# Patient Record
Sex: Female | Born: 1935 | Race: White | Hispanic: No | Marital: Married | Smoking: Never smoker
Health system: Southern US, Community
[De-identification: ages and names within clinical notes are randomized; demographics above are authoritative.]

## PROBLEM LIST (undated history)

## (undated) DIAGNOSIS — I10 Essential (primary) hypertension: Secondary | ICD-10-CM

## (undated) DIAGNOSIS — E538 Deficiency of other specified B group vitamins: Secondary | ICD-10-CM

## (undated) DIAGNOSIS — C07 Malignant neoplasm of parotid gland: Secondary | ICD-10-CM

## (undated) DIAGNOSIS — E785 Hyperlipidemia, unspecified: Secondary | ICD-10-CM

## (undated) DIAGNOSIS — I493 Ventricular premature depolarization: Secondary | ICD-10-CM

## (undated) DIAGNOSIS — I2510 Atherosclerotic heart disease of native coronary artery without angina pectoris: Secondary | ICD-10-CM

## (undated) DIAGNOSIS — E042 Nontoxic multinodular goiter: Secondary | ICD-10-CM

## (undated) HISTORY — PX: CORONARY ARTERY BYPASS GRAFT: SHX141

## (undated) HISTORY — DX: Nontoxic multinodular goiter: E04.2

## (undated) HISTORY — PX: ABDOMINAL HYSTERECTOMY: SUR658

## (undated) HISTORY — PX: MASTECTOMY: SHX3

## (undated) HISTORY — DX: Hyperlipidemia, unspecified: E78.5

## (undated) HISTORY — DX: Ventricular premature depolarization: I49.3

## (undated) HISTORY — DX: Essential (primary) hypertension: I10

## (undated) HISTORY — DX: Malignant neoplasm of parotid gland: C07

## (undated) HISTORY — DX: Atherosclerotic heart disease of native coronary artery without angina pectoris: I25.10

## (undated) HISTORY — DX: Deficiency of other specified B group vitamins: E53.8

---

## 2000-05-24 ENCOUNTER — Encounter: Admission: RE | Admit: 2000-05-24 | Discharge: 2000-05-24 | Payer: Self-pay | Admitting: Internal Medicine

## 2000-05-24 ENCOUNTER — Encounter: Payer: Self-pay | Admitting: Internal Medicine

## 2002-01-23 ENCOUNTER — Inpatient Hospital Stay (HOSPITAL_COMMUNITY): Admission: RE | Admit: 2002-01-23 | Discharge: 2002-01-29 | Payer: Self-pay | Admitting: Cardiology

## 2002-01-23 ENCOUNTER — Encounter: Payer: Self-pay | Admitting: Surgery

## 2002-01-24 ENCOUNTER — Encounter: Payer: Self-pay | Admitting: Surgery

## 2002-01-25 ENCOUNTER — Encounter: Payer: Self-pay | Admitting: Surgery

## 2002-01-26 ENCOUNTER — Encounter: Payer: Self-pay | Admitting: Cardiothoracic Surgery

## 2002-01-27 ENCOUNTER — Encounter: Payer: Self-pay | Admitting: Cardiothoracic Surgery

## 2015-11-17 DIAGNOSIS — E785 Hyperlipidemia, unspecified: Secondary | ICD-10-CM

## 2015-11-17 DIAGNOSIS — I10 Essential (primary) hypertension: Secondary | ICD-10-CM | POA: Insufficient documentation

## 2015-11-17 DIAGNOSIS — I2510 Atherosclerotic heart disease of native coronary artery without angina pectoris: Secondary | ICD-10-CM | POA: Insufficient documentation

## 2015-11-17 DIAGNOSIS — I493 Ventricular premature depolarization: Secondary | ICD-10-CM

## 2015-11-17 HISTORY — DX: Ventricular premature depolarization: I49.3

## 2015-11-17 HISTORY — DX: Atherosclerotic heart disease of native coronary artery without angina pectoris: I25.10

## 2015-11-17 HISTORY — DX: Essential (primary) hypertension: I10

## 2015-11-17 HISTORY — DX: Hyperlipidemia, unspecified: E78.5

## 2016-08-14 DIAGNOSIS — C07 Malignant neoplasm of parotid gland: Secondary | ICD-10-CM | POA: Insufficient documentation

## 2016-08-14 DIAGNOSIS — E042 Nontoxic multinodular goiter: Secondary | ICD-10-CM

## 2016-08-14 HISTORY — DX: Nontoxic multinodular goiter: E04.2

## 2016-08-14 HISTORY — DX: Malignant neoplasm of parotid gland: C07

## 2016-10-16 DIAGNOSIS — I10 Essential (primary) hypertension: Secondary | ICD-10-CM | POA: Diagnosis not present

## 2016-10-16 DIAGNOSIS — F419 Anxiety disorder, unspecified: Secondary | ICD-10-CM

## 2016-10-16 DIAGNOSIS — Z951 Presence of aortocoronary bypass graft: Secondary | ICD-10-CM | POA: Diagnosis not present

## 2016-10-16 DIAGNOSIS — K219 Gastro-esophageal reflux disease without esophagitis: Secondary | ICD-10-CM

## 2016-10-16 DIAGNOSIS — I2510 Atherosclerotic heart disease of native coronary artery without angina pectoris: Secondary | ICD-10-CM | POA: Diagnosis not present

## 2016-10-16 DIAGNOSIS — E785 Hyperlipidemia, unspecified: Secondary | ICD-10-CM

## 2016-10-16 DIAGNOSIS — I214 Non-ST elevation (NSTEMI) myocardial infarction: Secondary | ICD-10-CM | POA: Diagnosis not present

## 2016-10-17 DIAGNOSIS — I10 Essential (primary) hypertension: Secondary | ICD-10-CM | POA: Diagnosis not present

## 2016-10-17 DIAGNOSIS — Z951 Presence of aortocoronary bypass graft: Secondary | ICD-10-CM | POA: Diagnosis not present

## 2016-10-17 DIAGNOSIS — I2510 Atherosclerotic heart disease of native coronary artery without angina pectoris: Secondary | ICD-10-CM | POA: Diagnosis not present

## 2016-10-17 DIAGNOSIS — I214 Non-ST elevation (NSTEMI) myocardial infarction: Secondary | ICD-10-CM | POA: Diagnosis not present

## 2016-10-18 DIAGNOSIS — I214 Non-ST elevation (NSTEMI) myocardial infarction: Secondary | ICD-10-CM

## 2016-10-18 HISTORY — DX: Non-ST elevation (NSTEMI) myocardial infarction: I21.4

## 2016-10-23 DIAGNOSIS — Z951 Presence of aortocoronary bypass graft: Secondary | ICD-10-CM

## 2016-10-23 DIAGNOSIS — I208 Other forms of angina pectoris: Secondary | ICD-10-CM | POA: Diagnosis not present

## 2016-10-23 DIAGNOSIS — E785 Hyperlipidemia, unspecified: Secondary | ICD-10-CM

## 2016-10-23 DIAGNOSIS — R079 Chest pain, unspecified: Secondary | ICD-10-CM | POA: Diagnosis not present

## 2016-10-23 DIAGNOSIS — I10 Essential (primary) hypertension: Secondary | ICD-10-CM | POA: Diagnosis not present

## 2016-10-23 DIAGNOSIS — I25810 Atherosclerosis of coronary artery bypass graft(s) without angina pectoris: Secondary | ICD-10-CM | POA: Diagnosis not present

## 2016-10-23 DIAGNOSIS — F419 Anxiety disorder, unspecified: Secondary | ICD-10-CM

## 2016-10-24 DIAGNOSIS — I25810 Atherosclerosis of coronary artery bypass graft(s) without angina pectoris: Secondary | ICD-10-CM | POA: Diagnosis not present

## 2016-10-24 DIAGNOSIS — I10 Essential (primary) hypertension: Secondary | ICD-10-CM | POA: Diagnosis not present

## 2016-10-24 DIAGNOSIS — R079 Chest pain, unspecified: Secondary | ICD-10-CM | POA: Diagnosis not present

## 2016-10-24 DIAGNOSIS — I208 Other forms of angina pectoris: Secondary | ICD-10-CM | POA: Diagnosis not present

## 2017-05-28 NOTE — Progress Notes (Deleted)
Cardiology Office Note:    Date:  05/28/2017   ID:  Jarome Matin, DOB [birth year], MRN [id number]  PCP:  No primary care provider on file.  Cardiologist:  Shirlee More, MD    Referring MD: No ref. provider found    ASSESSMENT:    1. Coronary artery disease of native artery of native heart with stable angina pectoris (HCC)    PLAN:    In order of problems listed above:  1. ***   Next appointment: ***   Medication Adjustments/Labs and Tests Ordered: Current medicines are reviewed at length with the patient today.  Concerns regarding medicines are outlined above.  No orders of the defined types were placed in this encounter.  No orders of the defined types were placed in this encounter.   No chief complaint on file.   History of Present Illness:    Christine Rich is a 83 y.o. female with a hx of Hypertension, hyperlipidemia,CABG in 2003 and non STEMI with  PCI/DES to SVG in January 2018 last seen in April 2018. Compliance with diet, lifestyle and medications: *** Past Medical History:  Diagnosis Date  . Coronary artery disease involving native coronary artery of native heart 11/17/2015   Overview:  s/p CABGSx5 in 2003Lexiscan MPS in Aug 2011 without ischemia, EF 65% Cardiolite 10/29/12: CONCLUSIONS:  Study quality: fair, with obvious inferior attenuation artifact reducing the sensitivity of the test  (1) diaphragm attenuation artifact - prior non-transmural infarction cannot be excluded  (2) normal LV size, with ? inferior hypokinesis - measured LVEF of 48% may be an underestimate  (3) no areas of (vasodilator) stress-induced hypoperfusion are identified.  Negative perfusion stress test for potenital ischemia.   Cardiac catheterization January 2018 drug-eluting stent to graft going to the posterior descending artery  . Essential hypertension 11/17/2015  . Hyperlipemia 11/17/2015  . Nontoxic multinodular goiter 08/14/2016  . 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 PVCs (premature ventricular contractions) 11/17/2015    Past Surgical History:  Procedure Laterality Date  . ABDOMINAL HYSTERECTOMY    . CORONARY ARTERY BYPASS GRAFT    . MASTECTOMY      Current Medications: No outpatient prescriptions have been marked as taking for the 05/29/17 encounter (Appointment) with Richardo Priest, MD.     Allergies:   Patient has no known allergies.   Social History   Social History  . Marital status: Married    Spouse name: N/A  . Number of children: N/A  . Years of education: N/A   Social History Main Topics  . Smoking status: Never Smoker  . Smokeless tobacco: Never Used  . Alcohol use No  . Drug use: No  . Sexual activity: Not on file   Other Topics Concern  . Not on file   Social History Narrative  . No narrative on file     Family History: The patient's ***family history includes Heart disease in her father and mother. ROS:   Please see the history of present illness.    All other systems reviewed and are negative.  EKGs/Labs/Other Studies Reviewed:    The following studies were reviewed today:  EKG:  EKG ordered today.  The ekg ordered today demonstrates ***  Recent Labs: No results found for requested labs within last 8760 hours.  Recent Lipid Panel No results found for: CHOL, TRIG, HDL, CHOLHDL, VLDL, LDLCALC, LDLDIRECT  Physical Exam:    VS:  There were no vitals taken for this visit.  Wt Readings from Last 3 Encounters:  No data found for Wt     GEN: *** Well nourished, well developed in no acute distress HEENT: Normal NECK: No JVD; No carotid bruits LYMPHATICS: No lymphadenopathy CARDIAC: ***RRR, no murmurs, rubs, gallops RESPIRATORY:  Clear to auscultation without rales, wheezing or rhonchi  ABDOMEN: Soft, non-tender, non-distended MUSCULOSKELETAL:  No edema; No deformity  SKIN: Warm and dry NEUROLOGIC:  Alert and oriented x 3 PSYCHIATRIC:  Normal affect    Signed, Shirlee More, MD    05/28/2017 4:07 PM    [facility]

## 2017-05-29 ENCOUNTER — Ambulatory Visit: Payer: BLUE CROSS/BLUE SHIELD | Admitting: Cardiology

## 2017-07-23 ENCOUNTER — Telehealth: Payer: Self-pay | Admitting: Cardiology

## 2017-07-24 NOTE — Telephone Encounter (Signed)
Started noteand not needed

## 2017-07-25 NOTE — Progress Notes (Signed)
Cardiology Office Note:    Date:  07/26/2017   ID:  Christine Rich, DOB [birth year], MRN [id number]  PCP:  Street, Sharon Mt, MD  Cardiologist:  Shirlee More, MD    Referring MD: Street, Sharon Mt, *  [facility]   ASSESSMENT:    1. Coronary artery disease of native artery of native heart with stable angina pectoris ([place])   2. Essential hypertension   3. PVCs (premature ventricular contractions)   4. Hyperlipidemia, unspecified hyperlipidemia type    PLAN:    In order of problems listed above:  1. Stable, can proceed with oral surgery provided her DAPT is not interrupted. Her CAD is stable the procedure is low risk and I don't feel she requires any further cardiac evaluation preoperatively. 2. Stable continue treatment including beta blocker ARB 3. Stable, no PVC is seen on todays EKG continue beta blocker 4. Stable continue her statin recent labs requested from her PCP for liver function and lipid profile.   Next appointment: 6 months   Medication Adjustments/Labs and Tests Ordered: Current medicines are reviewed at length with the patient today.  Concerns regarding medicines are outlined above.  No orders of the defined types were placed in this encounter.  No orders of the defined types were placed in this encounter.   Chief Complaint  Patient presents with  . Follow-up    6 month flup appt    History of Present Illness:    Christine Rich is a 83 y.o. female with a hx of CAD with CABG in 200[address] and nonSTEMI 10/17/16 with PCI and DES to SVG to PDA and EF 55-60%,, Dyslipidemia, HTN, S/P CABG in 2003  last seen 6 months ago.She is scheduled for extensive dental surgery. Thet are aware that she needs to continue DAPT without interuption.She has had no angina dyspnea palpitation or syncope.I discussed her care plan with oral surgery. Compliance with diet, lifestyle and medications: yes  Past Medical History:  Diagnosis Date  . Coronary artery disease  involving native coronary artery of native heart 11/17/2015   Overview:  s/p CABGSx5 in 2003Lexiscan MPS in Aug 2011 without ischemia, EF 65% Cardiolite 10/29/12: CONCLUSIONS:  Study quality: fair, with obvious inferior attenuation artifact reducing the sensitivity of the test  (1) diaphragm attenuation artifact - prior non-transmural infarction cannot be excluded  (2) normal LV size, with ? inferior hypokinesis - measured LVEF of 48% may be an underestimate  (3) no areas of (vasodilator) stress-induced hypoperfusion are identified.  Negative perfusion stress test for potenital ischemia.   Cardiac catheterization January 2018 drug-eluting stent to graft going to the posterior descending artery  . Essential hypertension 11/17/2015  . Hyperlipemia 11/17/2015  . Nontoxic multinodular goiter 08/14/2016  . 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 PVCs (premature ventricular contractions) 11/17/2015    Past Surgical History:  Procedure Laterality Date  . ABDOMINAL HYSTERECTOMY    . CORONARY ARTERY BYPASS GRAFT    . MASTECTOMY      Current Medications: Current Meds  Medication Sig  . aspirin EC 81 MG tablet Take 81 mg by mouth daily.  . citalopram (CELEXA) 20 MG tablet Take 20 mg by mouth daily.  Marland Kitchen losartan (COZAAR) 25 MG tablet Take 25 mg by mouth daily.  . metoprolol tartrate (LOPRESSOR) 50 MG tablet Take 75 mg by mouth 2 (two) times daily.  . prasugrel (EFFIENT) 10 MG TABS tablet Take 10 mg by mouth daily.  . rosuvastatin (CRESTOR) 40 MG  tablet Take 40 mg by mouth daily.     Allergies:   Patient has no known allergies.   Social History   Social History  . Marital status: Married    Spouse name: N/A  . Number of children: N/A  . Years of education: N/A   Social History Main Topics  . Smoking status: Never Smoker  . Smokeless tobacco: Never Used  . Alcohol use No  . Drug use: No  . Sexual activity: Not Asked   Other Topics Concern  . None   Social History  Narrative  . None     Family History: The patient's family history includes Heart disease in her father and mother. ROS:   Please see the history of present illness.    All other systems reviewed and are negative.  EKGs/Labs/Other Studies Reviewed:    The following studies were reviewed today:  EKG:  EKG ordered today.  The ekg ordered today demonstrates [place], RBBB  Recent Labs: recent labs requseted from her PCP No results found for requested labs within last 8760 hours.  Recent Lipid Panel No results found for: CHOL, TRIG, HDL, CHOLHDL, VLDL, LDLCALC, LDLDIRECT  Physical Exam:    VS:  Ht 5\' 1"  (1.549 m)   Wt 155 lb 6.4 oz (70.5 kg)   BMI 29.36 kg/m     Wt Readings from Last 3 Encounters:  07/26/17 155 lb 6.4 oz (70.5 kg)     GEN:  Well nourished, well developed in no acute distress HEENT: Normal NECK: No JVD; No carotid bruits LYMPHATICS: No lymphadenopathy CARDIAC: RRR, no murmurs, rubs, gallops RESPIRATORY:  Clear to auscultation without rales, wheezing or rhonchi  ABDOMEN: Soft, non-tender, non-distended MUSCULOSKELETAL:  No edema; No deformity  SKIN: Warm and dry NEUROLOGIC:  Alert and oriented x 3 PSYCHIATRIC:  Normal affect    Signed, Shirlee More, MD  07/26/2017 1:28 PM    [facility]

## 2017-07-26 ENCOUNTER — Encounter: Payer: Self-pay | Admitting: Cardiology

## 2017-07-26 ENCOUNTER — Ambulatory Visit (INDEPENDENT_AMBULATORY_CARE_PROVIDER_SITE_OTHER): Payer: Medicare Other | Admitting: Cardiology

## 2017-07-26 VITALS — BP 124/74 | HR 64 | Ht 61.0 in | Wt 155.4 lb

## 2017-07-26 DIAGNOSIS — I25118 Atherosclerotic heart disease of native coronary artery with other forms of angina pectoris: Secondary | ICD-10-CM | POA: Diagnosis not present

## 2017-07-26 DIAGNOSIS — E785 Hyperlipidemia, unspecified: Secondary | ICD-10-CM | POA: Diagnosis not present

## 2017-07-26 DIAGNOSIS — I493 Ventricular premature depolarization: Secondary | ICD-10-CM

## 2017-07-26 DIAGNOSIS — I10 Essential (primary) hypertension: Secondary | ICD-10-CM | POA: Diagnosis not present

## 2017-07-26 NOTE — Patient Instructions (Signed)

## 2017-12-31 ENCOUNTER — Telehealth: Payer: Self-pay | Admitting: Cardiology

## 2017-12-31 MED ORDER — PRASUGREL HCL 10 MG PO TABS: 10.0000 mg | ORAL_TABLET | Freq: Every day | ORAL | 7 refills | Status: DC

## 2017-12-31 NOTE — Telephone Encounter (Signed)
Call Prasugrel to [facility]

## 2017-12-31 NOTE — Telephone Encounter (Signed)
Refill sent.

## 2018-01-23 DIAGNOSIS — M542 Cervicalgia: Secondary | ICD-10-CM

## 2018-01-23 DIAGNOSIS — C50912 Malignant neoplasm of unspecified site of left female breast: Secondary | ICD-10-CM

## 2018-01-23 DIAGNOSIS — M47812 Spondylosis without myelopathy or radiculopathy, cervical region: Secondary | ICD-10-CM

## 2018-01-23 HISTORY — DX: Spondylosis without myelopathy or radiculopathy, cervical region: M47.812

## 2018-01-23 HISTORY — DX: Cervicalgia: M54.2

## 2018-01-23 HISTORY — DX: Malignant neoplasm of unspecified site of left female breast: C50.912

## 2018-09-03 ENCOUNTER — Telehealth: Payer: Self-pay | Admitting: Cardiology

## 2018-09-03 NOTE — Telephone Encounter (Signed)
Wants nitro called to [facility]/past due for follow up (due in April) wouldn't schedule appt

## 2018-09-04 MED ORDER — NITROGLYCERIN 0.4 MG SL SUBL: 0.4000 mg | SUBLINGUAL_TABLET | SUBLINGUAL | 0 refills | Status: DC | PRN

## 2018-09-04 NOTE — Telephone Encounter (Signed)
Patient agreeable to schedule overdue follow up appointment on 10/25/17 at 9:00 am with Dr. Bettina Gavia. Refill for nitroglycerin sent to [facility]. No further questions.

## 2018-09-04 NOTE — Addendum Note (Signed)
Addended by: Austin Miles on: 09/04/2018 03:20 PM   Modules accepted: Orders

## 2018-10-24 NOTE — Progress Notes (Deleted)
Cardiology Office Note:    Date:  10/24/2018   ID:  Christine Rich, DOB 07/04/1934, MRN [id number]  PCP:  Street, Sharon Mt, MD  Cardiologist:  Shirlee More, MD    Referring MD: [address] Sharon Mt, *    ASSESSMENT:    No diagnosis found. PLAN:    In order of problems listed above:  1. ***   Next appointment: ***   Medication Adjustments/Labs and Tests Ordered: Current medicines are reviewed at length with the patient today.  Concerns regarding medicines are outlined above.  No orders of the defined types were placed in this encounter.  No orders of the defined types were placed in this encounter.   No chief complaint on file.   History of Present Illness:    Christine Rich is a 85 y.o. female with a hx of CAD with CABG in 2003, nonSTEMI 10/17/16 with PCI and DES to SVG to PDA with EF 55-60%,Dyslipidemia, HTN  last seen 07/26/17. Compliance with diet, lifestyle and medications: *** Past Medical History:  Diagnosis Date  . Coronary artery disease involving native coronary artery of native heart 11/17/2015   Overview:  s/p CABGSx5 in 2003Lexiscan MPS in Aug 2011 without ischemia, EF 65% Cardiolite 10/29/12: CONCLUSIONS:  Study quality: fair, with obvious inferior attenuation artifact reducing the sensitivity of the test  (1) diaphragm attenuation artifact - prior non-transmural infarction cannot be excluded  (2) normal LV size, with ? inferior hypokinesis - measured LVEF of 48% may be an underestimate  (3) no areas of (vasodilator) stress-induced hypoperfusion are identified.  Negative perfusion stress test for potenital ischemia.   Cardiac catheterization January 2018 drug-eluting stent to graft going to the posterior descending artery  . Essential hypertension 11/17/2015  . Hyperlipemia 11/17/2015  . Nontoxic multinodular goiter 08/14/2016  . 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 PVCs (premature ventricular contractions) 11/17/2015    Past  Surgical History:  Procedure Laterality Date  . ABDOMINAL HYSTERECTOMY    . CORONARY ARTERY BYPASS GRAFT    . MASTECTOMY      Current Medications: No outpatient medications have been marked as taking for the 10/25/18 encounter (Appointment) with Richardo Priest, MD.     Allergies:   Patient has no known allergies.   Social History   Socioeconomic History  . Marital status: Married    Spouse name: Not on file  . Number of children: Not on file  . Years of education: Not on file  . Highest education level: Not on file  Occupational History  . Not on file  Social Needs  . Financial resource strain: Not on file  . Food insecurity:    Worry: Not on file    Inability: Not on file  . Transportation needs:    Medical: Not on file    Non-medical: Not on file  Tobacco Use  . Smoking status: Never Smoker  . Smokeless tobacco: Never Used  Substance and Sexual Activity  . Alcohol use: No  . Drug use: No  . Sexual activity: Not on file  Lifestyle  . Physical activity:    Days per week: Not on file    Minutes per session: Not on file  . Stress: Not on file  Relationships  . Social connections:    Talks on phone: Not on file    Gets together: Not on file    Attends religious service: Not on file    Active member of club or organization: Not on  file    Attends meetings of clubs or organizations: Not on file    Relationship status: Not on file  Other Topics Concern  . Not on file  Social History Narrative  . Not on file     Family History: The patient's ***family history includes Heart disease in her father and mother. ROS:   Please see the history of present illness.    All other systems reviewed and are negative.  EKGs/Labs/Other Studies Reviewed:    The following studies were reviewed today:  EKG:  EKG ordered today.  The ekg ordered today demonstrates ***  Recent Labs: No results found for requested labs within last 8760 hours.  Recent Lipid Panel No results  found for: CHOL, TRIG, HDL, CHOLHDL, VLDL, LDLCALC, LDLDIRECT  Physical Exam:    VS:  There were no vitals taken for this visit.    Wt Readings from Last 3 Encounters:  07/26/17 155 lb 6.4 oz (70.5 kg)     GEN: *** Well nourished, well developed in no acute distress HEENT: Normal NECK: No JVD; No carotid bruits LYMPHATICS: No lymphadenopathy CARDIAC: ***RRR, no murmurs, rubs, gallops RESPIRATORY:  Clear to auscultation without rales, wheezing or rhonchi  ABDOMEN: Soft, non-tender, non-distended MUSCULOSKELETAL:  No edema; No deformity  SKIN: Warm and dry NEUROLOGIC:  Alert and oriented x 3 PSYCHIATRIC:  Normal affect    Signed, Shirlee More, MD  10/24/2018 3:15 PM    [place]

## 2018-10-25 ENCOUNTER — Ambulatory Visit: Payer: Medicare Other | Admitting: Cardiology

## 2018-12-10 ENCOUNTER — Telehealth: Payer: Self-pay | Admitting: Cardiology

## 2018-12-10 ENCOUNTER — Other Ambulatory Visit: Payer: Self-pay | Admitting: Cardiology

## 2018-12-10 MED ORDER — NITROGLYCERIN 0.4 MG SL SUBL: 0.4000 mg | SUBLINGUAL_TABLET | SUBLINGUAL | 0 refills | Status: DC | PRN

## 2018-12-10 NOTE — Telephone Encounter (Signed)
Refill for nitrostat sent to [facility]

## 2018-12-12 ENCOUNTER — Other Ambulatory Visit: Payer: Self-pay | Admitting: Cardiology

## 2019-01-23 ENCOUNTER — Other Ambulatory Visit: Payer: Self-pay | Admitting: Cardiology

## 2019-03-25 NOTE — Progress Notes (Signed)
Cardiology Office Note:    Date:  03/26/2019   ID:  Christine Rich, DOB [birth year], MRN [id number]  PCP:  Street, Sharon Mt, MD  Cardiologist:  Shirlee More, MD    Referring MD: [address] Sharon Mt, *    ASSESSMENT:    1. Coronary artery disease of native artery of native heart with stable angina pectoris ([place])   2. Essential hypertension   3. Mixed hyperlipidemia   4. PVCs (premature ventricular contractions)    PLAN:    In order of problems listed above:  1. CAD -stable CAD having no angina after previous bypass and PCI and stent to the vein graft 2018, she will transition to clopidogrel from Effient continue aspirin at this time I do not think she requires an ischemia evaluation. 2. HTN -not at target switch to a more potent ARB and if remains greater than 1 78-2 50 systolic she will require additional thiazide diuretic 3. HLD -stable continue high intensity statin check liver function lipid profile 4. PVC - Noted additional hisotry nontoxic multinodular goiter.    Next appointment: 6 months virtual   Medication Adjustments/Labs and Tests Ordered: Current medicines are reviewed at length with the patient today.  Concerns regarding medicines are outlined above.  No orders of the defined types were placed in this encounter.  No orders of the defined types were placed in this encounter.   No chief complaint on file.   History of Present Illness:    Christine Rich is a 85 y.o. female with PMH CAD s/p CABG 2013 and NSTEMI 10/17/16 with PCi and DES to SVG to PDA EF 55-60%, DLD, HTN last seen 07/26/2017.  Labs 03/08/2018: A1c 5.7, normal liver enzymes, BUN 13, creatinine 0.80 K4 TSH 0.315 Total cholesterol 210 HDL 41 LDL 142 Triglycerides 137   She is overdue for follow-up and despite being more than a year remote from her PCI and stent to the vein graft continues on Effient.  She will discontinue and transition to dual antiplatelet therapy with clopidogrel and aspirin.   Her home blood pressures are running greater than 956 systolic and I will switch her to a more potent ARB and if they continue to be elevated will add a tot thiazide diuretic.  She is overdue for lab work will check renal function liver function and lipid profile today.  She has been sheltering in place at her home at [place] like has had no chest pain edema shortness of breath palpitation or syncope  Compliance with diet, lifestyle and medications: Yes Past Medical History:  Diagnosis Date  . Coronary artery disease involving native coronary artery of native heart 11/17/2015   Overview:  s/p CABGSx5 in 2003Lexiscan MPS in Aug 2011 without ischemia, EF 65% Cardiolite 10/29/12: CONCLUSIONS:  Study quality: fair, with obvious inferior attenuation artifact reducing the sensitivity of the test  (1) diaphragm attenuation artifact - prior non-transmural infarction cannot be excluded  (2) normal LV size, with ? inferior hypokinesis - measured LVEF of 48% may be an underestimate  (3) no areas of (vasodilator) stress-induced hypoperfusion are identified.  Negative perfusion stress test for potenital ischemia.   Cardiac catheterization January 2018 drug-eluting stent to graft going to the posterior descending artery  . Essential hypertension 11/17/2015  . Hyperlipemia 11/17/2015  . Nontoxic multinodular goiter 08/14/2016  . 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 PVCs (premature ventricular contractions) 11/17/2015    Past Surgical History:  Procedure Laterality Date  . ABDOMINAL HYSTERECTOMY    .  CORONARY ARTERY BYPASS GRAFT    . MASTECTOMY      Current Medications: No outpatient medications have been marked as taking for the 03/26/19 encounter (Appointment) with Richardo Priest, MD.     Allergies:   Patient has no known allergies.   Social History   Socioeconomic History  . Marital status: Married    Spouse name: Not on file  . Number of children: Not on file  . Years of  education: Not on file  . Highest education level: Not on file  Occupational History  . Not on file  Social Needs  . Financial resource strain: Not on file  . Food insecurity:    Worry: Not on file    Inability: Not on file  . Transportation needs:    Medical: Not on file    Non-medical: Not on file  Tobacco Use  . Smoking status: Never Smoker  . Smokeless tobacco: Never Used  Substance and Sexual Activity  . Alcohol use: No  . Drug use: No  . Sexual activity: Not on file  Lifestyle  . Physical activity:    Days per week: Not on file    Minutes per session: Not on file  . Stress: Not on file  Relationships  . Social connections:    Talks on phone: Not on file    Gets together: Not on file    Attends religious service: Not on file    Active member of club or organization: Not on file    Attends meetings of clubs or organizations: Not on file    Relationship status: Not on file  Other Topics Concern  . Not on file  Social History Narrative  . Not on file     Family History: The patient's family history includes Heart disease in her father and mother. ROS:   Please see the history of present illness.    All other systems reviewed and are negative.  EKGs/Labs/Other Studies Reviewed:    The following studies were reviewed today:  EKG:  EKG ordered today and personally reviewed.  The ekg ordered today demonstrates sinus rhythm to PVCs nonspecific T waves incomplete right bundle branch block  Recent Labs: No results found for requested labs within last 8760 hours.  Recent Lipid Panel No results found for: CHOL, TRIG, HDL, CHOLHDL, VLDL, LDLCALC, LDLDIRECT  Physical Exam:    VS:  There were no vitals taken for this visit.    Wt Readings from Last 3 Encounters:  07/26/17 155 lb 6.4 oz (70.5 kg)     GEN:  Well nourished, well developed in no acute distress HEENT: Normal NECK: No JVD; No carotid bruits LYMPHATICS: No lymphadenopathy CARDIAC: RRR, no murmurs,  rubs, gallops RESPIRATORY:  Clear to auscultation without rales, wheezing or rhonchi  ABDOMEN: Soft, non-tender, non-distended MUSCULOSKELETAL:  No edema; No deformity  SKIN: Warm and dry NEUROLOGIC:  Alert and oriented x 3 PSYCHIATRIC:  Normal affect    Signed, Shirlee More, MD  03/26/2019 8:30 AM    [place]

## 2019-03-26 ENCOUNTER — Other Ambulatory Visit: Payer: Self-pay

## 2019-03-26 ENCOUNTER — Ambulatory Visit (INDEPENDENT_AMBULATORY_CARE_PROVIDER_SITE_OTHER): Payer: Medicare Other | Admitting: Cardiology

## 2019-03-26 VITALS — BP 162/86 | HR 65 | Temp 96.8°F | Ht 61.0 in | Wt 150.4 lb

## 2019-03-26 DIAGNOSIS — E782 Mixed hyperlipidemia: Secondary | ICD-10-CM

## 2019-03-26 DIAGNOSIS — I10 Essential (primary) hypertension: Secondary | ICD-10-CM | POA: Diagnosis not present

## 2019-03-26 DIAGNOSIS — I493 Ventricular premature depolarization: Secondary | ICD-10-CM

## 2019-03-26 DIAGNOSIS — I25118 Atherosclerotic heart disease of native coronary artery with other forms of angina pectoris: Secondary | ICD-10-CM

## 2019-03-26 MED ORDER — ROSUVASTATIN CALCIUM 40 MG PO TABS: 40.0000 mg | ORAL_TABLET | Freq: Every day | ORAL | 3 refills | Status: DC

## 2019-03-26 MED ORDER — CLOPIDOGREL BISULFATE 75 MG PO TABS: 75.0000 mg | ORAL_TABLET | Freq: Every day | ORAL | 3 refills | Status: DC

## 2019-03-26 MED ORDER — TELMISARTAN 20 MG PO TABS: 20.0000 mg | ORAL_TABLET | Freq: Every day | ORAL | 3 refills | Status: DC

## 2019-03-26 MED ORDER — METOPROLOL TARTRATE 50 MG PO TABS: 75.0000 mg | ORAL_TABLET | Freq: Two times a day (BID) | ORAL | 3 refills | Status: DC

## 2019-03-26 MED ORDER — ISOSORBIDE MONONITRATE ER 30 MG PO TB24: 30.0000 mg | ORAL_TABLET | Freq: Every day | ORAL | 3 refills | Status: DC

## 2019-03-26 NOTE — Addendum Note (Signed)
Addended by: Particia Nearing B on: 03/26/2019 09:39 AM   Modules accepted: Orders

## 2019-03-26 NOTE — Patient Instructions (Signed)
Medication Instructions:  Your physician has recommended you make the following change in your medication:   STOP: Effient STOP: Losartan  START: PLAVIX 75 mg (1 tab) daily START: Telmisartan 20 mg (1 TAb) daily  If you need a refill on your cardiac medications before your next appointment, please call your pharmacy.   Lab work: Your physician recommends that you return for lab work in: [place]  If you have labs (blood work) drawn today and your tests are completely normal, you will receive your results only by: Marland Kitchen MyChart Message (if you have MyChart) OR . A paper copy in the mail If you have any lab test that is abnormal or we need to change your treatment, we will call you to review the results.  Testing/Procedures: None  Follow-Up: At [facility], you and your health needs are our priority.  As part of our continuing mission to provide you with exceptional heart care, we have created designated Provider Care Teams.  These Care Teams include your primary Cardiologist (physician) and Advanced Practice Providers (APPs -  Physician Assistants and Nurse Practitioners) who all work together to provide you with the care you need, when you need it. You will need a follow up appointment in 6 months.   Any Other Special Instructions Will Be Listed Below (If Applicable).   CHECK BLOOD PRESSURE AND RECORD. CALL IF SYSTOLIC(TOP NUMBER) IS GREATER THAN 140 CONSISTENTLY   Blood Pressure Record Sheet To take your blood pressure, you will need a blood pressure machine. You can buy a blood pressure machine (blood pressure monitor) at your clinic, drug store, or online. When choosing one, consider:  An automatic monitor that has an arm cuff.  A cuff that wraps snugly around your upper arm. You should be able to fit only one finger between your arm and the cuff.  A device that stores blood pressure reading results.  Do not choose a monitor that measures your blood pressure from  your wrist or finger. Follow your health care provider's instructions for how to take your blood pressure. To use this form:  Get one reading in the morning (a.m.) before you take any medicines.  Get one reading in the evening (p.m.) before supper.  Take at least 2 readings with each blood pressure check. This makes sure the results are correct. Wait 1-2 minutes between measurements.  Write down the results in the spaces on this form.  Repeat this once a week, or as told by your health care provider.  Make a follow-up appointment with your health care provider to discuss the results. Blood pressure log Date: _______________________  a.m. _____________________(1st reading) _____________________(2nd reading)  p.m. _____________________(1st reading) _____________________(2nd reading) Date: _______________________  a.m. _____________________(1st reading) _____________________(2nd reading)  p.m. _____________________(1st reading) _____________________(2nd reading) Date: _______________________  a.m. _____________________(1st reading) _____________________(2nd reading)  p.m. _____________________(1st reading) _____________________(2nd reading) Date: _______________________  a.m. _____________________(1st reading) _____________________(2nd reading)  p.m. _____________________(1st reading) _____________________(2nd reading) Date: _______________________  a.m. _____________________(1st reading) _____________________(2nd reading)  p.m. _____________________(1st reading) _____________________(2nd reading) This information is not intended to replace advice given to you by your health care provider. Make sure you discuss any questions you have with your health care provider. Document Released: 07/01/2003 Document Revised: 10/02/2017 Document Reviewed: 10/02/2017 Elsevier Interactive Patient Education  2019 [employer].

## 2019-03-26 NOTE — Addendum Note (Signed)
Addended by: Particia Nearing B on: 03/26/2019 09:46 AM   Modules accepted: Orders

## 2019-03-27 LAB — COMPREHENSIVE METABOLIC PANEL
ALT: 15 IU/L (ref 0–32)
AST: 22 IU/L (ref 0–40)
Albumin/Globulin Ratio: 2 (ref 1.2–2.2)
Albumin: 4.5 g/dL (ref 3.6–4.6)
Alkaline Phosphatase: 55 IU/L (ref 39–117)
BUN/Creatinine Ratio: 14 (ref 12–28)
BUN: 12 mg/dL (ref 8–27)
Bilirubin Total: 0.5 mg/dL (ref 0.0–1.2)
CO2: 23 mmol/L (ref 20–29)
Calcium: 9.6 mg/dL (ref 8.7–10.3)
Chloride: 104 mmol/L (ref 96–106)
Creatinine, Ser: 0.87 mg/dL (ref 0.57–1.00)
GFR calc Af Amer: 72 mL/min/{1.73_m2} (ref >59)
GFR calc non Af Amer: 62 mL/min/{1.73_m2} (ref >59)
Globulin, Total: 2.2 g/dL (ref 1.5–4.5)
Glucose: 90 mg/dL (ref 65–99)
Potassium: 4.3 mmol/L (ref 3.5–5.2)
Sodium: 141 mmol/L (ref 134–144)
Total Protein: 6.7 g/dL (ref 6.0–8.5)

## 2019-03-27 LAB — LIPID PANEL
Chol/HDL Ratio: 3.1 ratio (ref 0.0–4.4)
Cholesterol, Total: 136 mg/dL (ref 100–199)
HDL: 44 mg/dL (ref >39)
LDL Calculated: 72 mg/dL (ref 0–99)
Triglycerides: 102 mg/dL (ref 0–149)
VLDL Cholesterol Cal: 20 mg/dL (ref 5–40)

## 2019-04-27 NOTE — Progress Notes (Signed)
Cardiology Office Note:    Date:  04/29/2019   ID:  Christine Rich, DOB 15-May-1934, MRN [id number]  PCP:  Street, Sharon Mt, MD  Cardiologist:  Shirlee More, MD    Referring MD: [address] [place] Mt, *    ASSESSMENT:    1. Coronary artery disease of native artery of native heart with stable angina pectoris ([place])   2. Essential hypertension   3. PVCs (premature ventricular contractions)   4. Mixed hyperlipidemia    PLAN:    In order of problems listed above:  1. CAD she had anginal episode after stopping her beta-blocker will have her resume and if she has recurrent angina she will need further ischemic evaluation.  Continue current treatment including dual antiplatelet. 2. Hypertension blood pressure relatively low reduce her ARB by 50% 3. PVCs with palpitation check a 1 week CO monitor 4. Hyperlipidemia stable recent lipid profile cholesterol 136 HDL 44 LDL 72 03/26/2019 she had normal liver function at that time.   Next appointment: 6 weeks   Medication Adjustments/Labs and Tests Ordered: Current medicines are reviewed at length with the patient today.  Concerns regarding medicines are outlined above.  No orders of the defined types were placed in this encounter.  No orders of the defined types were placed in this encounter.   Chief Complaint  Patient presents with  . Follow-up    for   . Coronary Artery Disease    History of Present Illness:    Christine Rich is a 85 y.o. female with a hx of CAD s/p CABG 2013 and NSTEMI 10/17/16 with PCi and DES to SVG to PDA EF 55-60%, DLD, HTN  last seen 03/26/2019. Compliance with diet, lifestyle and medications: No  Last visit we had transitioned her from Effient to Plavix some fashion she stopped metoprolol after which she did not feel well she had an episode of chest pain typical angina relieved with 2 nitroglycerin went to [facility] emergency room 04/22/2019.  Her troponin was normal CBC renal function normal EKG  showed right bundle branch block no ischemic changes and was discharged.  She told me since she stopped the beta-blocker she has not felt well she is also been lightheaded she has had increased palpitation.  I will have her resume a dose of beta-blocker blood pressure was low reduce her ARB will plan a ZIO monitor for 1 week to assess her rhythm and see her back in 6 weeks.  At this time I do not think she requires an ischemia evaluation. Past Medical History:  Diagnosis Date  . Coronary artery disease involving native coronary artery of native heart 11/17/2015   Overview:  s/p CABGSx5 in 2003Lexiscan MPS in Aug 2011 without ischemia, EF 65% Cardiolite 10/29/12: CONCLUSIONS:  Study quality: fair, with obvious inferior attenuation artifact reducing the sensitivity of the test  (1) diaphragm attenuation artifact - prior non-transmural infarction cannot be excluded  (2) normal LV size, with ? inferior hypokinesis - measured LVEF of 48% may be an underestimate  (3) no areas of (vasodilator) stress-induced hypoperfusion are identified.  Negative perfusion stress test for potenital ischemia.   Cardiac catheterization January 2018 drug-eluting stent to graft going to the posterior descending artery  . Essential hypertension 11/17/2015  . Hyperlipemia 11/17/2015  . Nontoxic multinodular goiter 08/14/2016  . 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 PVCs (premature ventricular contractions) 11/17/2015    Past Surgical History:  Procedure Laterality Date  . ABDOMINAL HYSTERECTOMY    .  CORONARY ARTERY BYPASS GRAFT    . MASTECTOMY      Current Medications: Current Meds  Medication Sig  . aspirin EC 81 MG tablet Take 81 mg by mouth daily.  . clopidogrel (PLAVIX) 75 MG tablet Take 1 tablet (75 mg total) by mouth daily.  . isosorbide mononitrate (IMDUR) 30 MG 24 hr tablet Take 1 tablet (30 mg total) by mouth daily.  . nitroGLYCERIN (NITROSTAT) 0.4 MG SL tablet Place 1 tablet (0.4 mg total)  under the tongue every 5 (five) minutes as needed for chest pain.  . rosuvastatin (CRESTOR) 40 MG tablet Take 1 tablet (40 mg total) by mouth daily.  Marland Kitchen telmisartan (MICARDIS) 20 MG tablet Take 1 tablet (20 mg total) by mouth daily.     Allergies:   Patient has no known allergies.   Social History   Socioeconomic History  . Marital status: Married    Spouse name: Not on file  . Number of children: Not on file  . Years of education: Not on file  . Highest education level: Not on file  Occupational History  . Not on file  Social Needs  . Financial resource strain: Not on file  . Food insecurity    Worry: Not on file    Inability: Not on file  . Transportation needs    Medical: Not on file    Non-medical: Not on file  Tobacco Use  . Smoking status: Never Smoker  . Smokeless tobacco: Never Used  Substance and Sexual Activity  . Alcohol use: No  . Drug use: No  . Sexual activity: Not on file  Lifestyle  . Physical activity    Days per week: Not on file    Minutes per session: Not on file  . Stress: Not on file  Relationships  . Social Herbalist on phone: Not on file    Gets together: Not on file    Attends religious service: Not on file    Active member of club or organization: Not on file    Attends meetings of clubs or organizations: Not on file    Relationship status: Not on file  Other Topics Concern  . Not on file  Social History Narrative  . Not on file     Family History: The patient's family history includes Heart disease in her father and mother. ROS:   Please see the history of present illness.    All other systems reviewed and are negative.  EKGs/Labs/Other Studies Reviewed:    The following studies were reviewed today:  EKG:  EKG ordered today and personally reviewed.  The ekg ordered today demonstrates sinus rhythm right bundle branch block artifact is seen  Recent Labs: 03/26/2019: ALT 15; BUN 12; Creatinine, Ser 0.87; Potassium 4.3;  Sodium 141  Recent Lipid Panel    Component Value Date/Time   CHOL 136 03/26/2019 0945   TRIG 102 03/26/2019 0945   HDL 44 03/26/2019 0945   CHOLHDL 3.1 03/26/2019 0945   LDLCALC 72 03/26/2019 0945    Physical Exam:    VS:  BP 106/68 (BP Location: Right Arm, Patient Position: Sitting, Cuff Size: Normal)   Ht 5\' 1"  (1.549 m)   Wt 146 lb 12.8 oz (66.6 kg)   SpO2 95%   BMI 27.74 kg/m     Wt Readings from Last 3 Encounters:  04/29/19 146 lb 12.8 oz (66.6 kg)  03/26/19 150 lb 6.4 oz (68.2 kg)  07/26/17 155 lb 6.4 oz (70.5  kg)     GEN: She is a little tremulous although tearful on entering the room and tells me this is not a good time in her life well nourished, well developed in no acute distress HEENT: Normal NECK: No JVD; No carotid bruits LYMPHATICS: No lymphadenopathy CARDIAC: RRR, no murmurs, rubs, gallops RESPIRATORY:  Clear to auscultation without rales, wheezing or rhonchi  ABDOMEN: Soft, non-tender, non-distended MUSCULOSKELETAL:  No edema; No deformity  SKIN: Warm and dry NEUROLOGIC:  Alert and oriented x 3 PSYCHIATRIC:  Normal affect    Signed, Shirlee More, MD  04/29/2019 5:03 PM    [place]

## 2019-04-29 ENCOUNTER — Ambulatory Visit (INDEPENDENT_AMBULATORY_CARE_PROVIDER_SITE_OTHER): Payer: Medicare Other | Admitting: Cardiology

## 2019-04-29 ENCOUNTER — Other Ambulatory Visit: Payer: Self-pay

## 2019-04-29 VITALS — BP 106/68 | Ht 61.0 in | Wt 146.8 lb

## 2019-04-29 DIAGNOSIS — E782 Mixed hyperlipidemia: Secondary | ICD-10-CM | POA: Diagnosis not present

## 2019-04-29 DIAGNOSIS — I25118 Atherosclerotic heart disease of native coronary artery with other forms of angina pectoris: Secondary | ICD-10-CM | POA: Diagnosis not present

## 2019-04-29 DIAGNOSIS — I10 Essential (primary) hypertension: Secondary | ICD-10-CM | POA: Diagnosis not present

## 2019-04-29 DIAGNOSIS — I493 Ventricular premature depolarization: Secondary | ICD-10-CM

## 2019-04-29 MED ORDER — METOPROLOL TARTRATE 25 MG PO TABS: 25.0000 mg | ORAL_TABLET | Freq: Two times a day (BID) | ORAL | 3 refills | Status: DC

## 2019-04-29 MED ORDER — TELMISARTAN 20 MG PO TABS: ORAL_TABLET | ORAL | 3 refills | Status: DC

## 2019-04-29 NOTE — Patient Instructions (Signed)
Medication Instructions:  Your physician has recommended you make the following change in your medication:   RESTART: Metoprolol 25mg : Take 1 tab twice daily  DECREASE Telmisartan 20 mg: Take 1 half tab (10 Mg) daily  If you need a refill on your cardiac medications before your next appointment, please call your pharmacy.   Lab work: None  If you have labs (blood work) drawn today and your tests are completely normal, you will receive your results only by: Marland Kitchen MyChart Message (if you have MyChart) OR . A paper copy in the mail If you have any lab test that is abnormal or we need to change your treatment, we will call you to review the results.  Testing/Procedures: Your physician has recommended that you wear a ZIO monitor. ZIO monitors are medical devices that record the heart's electrical activity. Doctors most often use these monitors to diagnose arrhythmias. Arrhythmias are problems with the speed or rhythm of the heartbeat. The monitor is a small, portable device. You can wear one while you do your normal daily activities. This is usually used to diagnose what is causing palpitations/syncope (passing out).    Follow-Up: At [facility], you and your health needs are our priority.  As part of our continuing mission to provide you with exceptional heart care, we have created designated Provider Care Teams.  These Care Teams include your primary Cardiologist (physician) and Advanced Practice Providers (APPs -  Physician Assistants and Nurse Practitioners) who all work together to provide you with the care you need, when you need it. You will need a follow up appointment in 6 weeks.   Any Other Special Instructions Will Be Listed Below (If Applicable).

## 2019-04-30 NOTE — Addendum Note (Signed)
Addended by: Jerl Santos R on: 04/30/2019 10:33 AM   Modules accepted: Orders

## 2019-05-20 ENCOUNTER — Other Ambulatory Visit (INDEPENDENT_AMBULATORY_CARE_PROVIDER_SITE_OTHER): Payer: Medicare Other

## 2019-05-20 DIAGNOSIS — I493 Ventricular premature depolarization: Secondary | ICD-10-CM | POA: Diagnosis not present

## 2019-06-05 ENCOUNTER — Other Ambulatory Visit: Payer: Self-pay | Admitting: Cardiology

## 2019-06-05 DIAGNOSIS — I493 Ventricular premature depolarization: Secondary | ICD-10-CM

## 2019-06-05 NOTE — Progress Notes (Signed)
Order(s) created erroneously. Erroneous order ID: [id number]  Order canceled by: Mike Craze  Order cancel date/time: 06/05/2019 10:49 AM

## 2019-06-06 DIAGNOSIS — R079 Chest pain, unspecified: Secondary | ICD-10-CM | POA: Insufficient documentation

## 2019-06-06 DIAGNOSIS — R531 Weakness: Secondary | ICD-10-CM

## 2019-06-06 DIAGNOSIS — Z20822 Contact with and (suspected) exposure to covid-19: Secondary | ICD-10-CM

## 2019-06-06 HISTORY — DX: Contact with and (suspected) exposure to covid-19: Z20.822

## 2019-06-06 HISTORY — DX: Weakness: R53.1

## 2019-06-06 HISTORY — DX: Chest pain, unspecified: R07.9

## 2019-06-12 NOTE — Progress Notes (Signed)
Cardiology Office Note:    Date:  06/13/2019   ID:  Christine Rich, DOB June 26, 1934, MRN [id number]  PCP:  Street, Sharon Mt, MD  Cardiologist:  Shirlee More, MD    Referring MD: [address] Sharon Mt, *    ASSESSMENT:    1. Abnormal CXR   2. Cough   3. Chest pain, unspecified type    PLAN:    In order of problems listed above:  1. There is a complex visit where she presents after hospitalization with ongoing symptoms or problems include CAD showing ischemia evaluation hypertension stable continue current treatment ongoing malaise and fatigue further evaluation check thyroids and cough with abnormal chest x-ray CT scan of the chest without contrast ordered.  I reviewed with her the results of her event monitor that shows no significant arrhythmia and she is not having any palpitation of note recently.  Her predominant complaint at this time is just overwhelming malaise and fatigue the cough and occasional angina.  She clearly is B12 deficient but I doubt it is responsible for symptoms I asked her to continue supplements to she is sees her primary care physician.   Next appointment: 6 weeks   Medication Adjustments/Labs and Tests Ordered: Current medicines are reviewed at length with the patient today.  Concerns regarding medicines are outlined above.  No orders of the defined types were placed in this encounter.  No orders of the defined types were placed in this encounter.   Chief Complaint  Patient presents with  . Follow-up    History of Present Illness:    Christine Rich is a 85 y.o. female with a hx of CAD s/p CABG 2013 and NSTEMI 10/17/16 with PCi and DES to SVG to PDA EF 55-60%, DLD, HTN  last seen 04/29/2019. Compliance with diet, lifestyle and medications: Yes  She had gone [facility] ED because she just felt fatigued and terribly.  She is admitted cardiac enzymes drawn that were normal echocardiogram unremarkable this to before seen by  cardiology and no real specific answer she is COVID-19 negative but her chest x-ray is abnormal she either have atelectasis and/or small effusions bilaterally.  She now says that her cough that was present is worsened.  She is not short of breath.  She has had episodes of angina relieved with nitroglycerin did not have an ischemia evaluation.  I reviewed the records her labs were normal B12 was mildly diminished 166 alone normal 200 but did not have a TSH.  She certainly appears hypothyroid to my office.  For further evaluation she will have a myocardial perfusion study regarding ischemia with known CAD check thyroid studies she certainly looks hypothyroid in the office and a CT scan of the chest without contrast to further evaluate if she has a primary lung problem.  I will see her back in my office after this testing is done.  She tells me she thinks COVID-19 is starting to make her depressed and its possible may contribute to her symptoms.  I spent greater than 25 minutes face-to-face with the patient and reviewed her records with her in real-time as I was unaware she been in the hospital she was switched from Effient to clopidogrel that was appropriate and was transitioned from 1 ARB to the other and I do not know why.  Please note her proBNP level was low at 24 and there were no findings of heart failure.  CBC was normal hemoglobin 13.8 and the cells  were not macrocytic.  Potassium 4.0 GFR 58 cc creatinine 0.92.  Echo 06/06/2019 at [facility]: Summary Normal left ventricular size and systolic function with no appreciable segmental abnormality. Ejection fraction is visually estimated at 0000000 Diastolic function Appears normal There is mild aortic sclerosis noted, with no evidence of stenosis. Mild aortic regurgitation. Study Highlights  A ZIO monitor was performed 6 days 23 hours beginning 05/20/2019 to assess symptomatic PVCs. The rhythm throughout was sinus with minimum average and maximum heart rates  of 44, 69 and 117 bpm.  The minimum rate was sinus bradycardia. There was 1 single triggered event with frequent PVCs. There were no pauses of 3 seconds or greater and no episodes of sinus node or AV nodal block. Ventricular arrhythmia was occasional PVCs rare 181 couplets and rare triplets 21.  There was 1 episode of nonsustained ventricular tachycardia 9 complexes in a relatively slow rate of 139 bpm. Supraventricular arrhythmia was rare with APCs.  There are no episodes of atrial fibrillation or flutter.  18 brief episodes of atrial tachycardia were present the longest 14 complexes at a rate of 96 bpm and the fastest 4 complexes at a rate of 148 bpm.  Conclusion overall  occasional ventricular and rare supraventricular arrhythmia with a single triggered event showing frequent PVCs and one 9 beat run of nonsustained ventricular tachycardia.   [id number]: There are end-stage degenerative changes of the left glenohumeral joint. The patient is status post prior median sternotomy. The heart size is stable. Aortic calcifications are noted. There is no pneumothorax. No large pleural effusion. There is some mild blunting of the costophrenic angles bilaterally suggestive of underlying trace pleural effusions and or atelectasis.  Past Medical History:  Diagnosis Date  . Coronary artery disease involving native coronary artery of native heart 11/17/2015   Overview:  s/p CABGSx5 in 2003Lexiscan MPS in Aug 2011 without ischemia, EF 65% Cardiolite 10/29/12: CONCLUSIONS:  Study quality: fair, with obvious inferior attenuation artifact reducing the sensitivity of the test  (1) diaphragm attenuation artifact - prior non-transmural infarction cannot be excluded  (2) normal LV size, with ? inferior hypokinesis - measured LVEF of 48% may be an underestimate  (3) no areas of (vasodilator) stress-induced hypoperfusion are identified.  Negative perfusion stress test for potenital ischemia.   Cardiac catheterization  January 2018 drug-eluting stent to graft going to the posterior descending artery  . Essential hypertension 11/17/2015  . Hyperlipemia 11/17/2015  . Nontoxic multinodular goiter 08/14/2016  . 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 PVCs (premature ventricular contractions) 11/17/2015  . Vitamin B 12 deficiency     Past Surgical History:  Procedure Laterality Date  . ABDOMINAL HYSTERECTOMY    . CORONARY ARTERY BYPASS GRAFT    . MASTECTOMY      Current Medications: Current Meds  Medication Sig  . aspirin EC 81 MG tablet Take 81 mg by mouth daily.  . clopidogrel (PLAVIX) 75 MG tablet Take 1 tablet (75 mg total) by mouth daily.  . isosorbide mononitrate (IMDUR) 30 MG 24 hr tablet Take 1 tablet (30 mg total) by mouth daily.  . metoprolol tartrate (LOPRESSOR) 50 MG tablet Take 75 mg by mouth 2 (two) times daily.  . nitroGLYCERIN (NITROSTAT) 0.4 MG SL tablet Place 1 tablet (0.4 mg total) under the tongue every 5 (five) minutes as needed for chest pain.  . rosuvastatin (CRESTOR) 40 MG tablet Take 1 tablet (40 mg total) by mouth daily.  . vitamin B-12 (CYANOCOBALAMIN) 1000 MCG tablet Take  1,000 mcg by mouth daily.     Allergies:   Patient has no known allergies.   Social History   Socioeconomic History  . Marital status: Married    Spouse name: Not on file  . Number of children: Not on file  . Years of education: Not on file  . Highest education level: Not on file  Occupational History  . Not on file  Social Needs  . Financial resource strain: Not on file  . Food insecurity    Worry: Not on file    Inability: Not on file  . Transportation needs    Medical: Not on file    Non-medical: Not on file  Tobacco Use  . Smoking status: Never Smoker  . Smokeless tobacco: Never Used  Substance and Sexual Activity  . Alcohol use: No  . Drug use: No  . Sexual activity: Not on file  Lifestyle  . Physical activity    Days per week: Not on file    Minutes per  session: Not on file  . Stress: Not on file  Relationships  . Social Herbalist on phone: Not on file    Gets together: Not on file    Attends religious service: Not on file    Active member of club or organization: Not on file    Attends meetings of clubs or organizations: Not on file    Relationship status: Not on file  Other Topics Concern  . Not on file  Social History Narrative  . Not on file     Family History: The patient's family history includes Heart disease in her father and mother. ROS:   Please see the history of present illness.    All other systems reviewed and are negative.  EKGs/Labs/Other Studies Reviewed:    The following studies were reviewed today:  EKG:  EKG \\at  [facility] the ekg did not show any ischemic changes Recent Labs: 03/26/2019: ALT 15; BUN 12; Creatinine, Ser 0.87; Potassium 4.3; Sodium 141  Recent Lipid Panel    Component Value Date/Time   CHOL 136 03/26/2019 0945   TRIG 102 03/26/2019 0945   HDL 44 03/26/2019 0945   CHOLHDL 3.1 03/26/2019 0945   LDLCALC 72 03/26/2019 0945    Physical Exam:    VS:  BP 114/70 (BP Location: Right Arm, Patient Position: Sitting, Cuff Size: Normal)   Pulse 61   Ht 5\' 1"  (1.549 m)   Wt 139 lb 12.8 oz (63.4 kg)   SpO2 91%   BMI 26.41 kg/m     Wt Readings from Last 3 Encounters:  06/13/19 139 lb 12.8 oz (63.4 kg)  04/29/19 146 lb 12.8 oz (66.6 kg)  03/26/19 150 lb 6.4 oz (68.2 kg)     GEN:  Well nourished, well developed in no acute distress HEENT: Normal NECK: No JVD; No carotid bruits LYMPHATICS: No lymphadenopathy CARDIAC: RRR, no murmurs, rubs, gallops RESPIRATORY:  Clear to auscultation without rales, wheezing or rhonchi  ABDOMEN: Soft, non-tender, non-distended MUSCULOSKELETAL:  No edema; No deformity  SKIN: Warm and dry NEUROLOGIC:  Alert and oriented x 3 PSYCHIATRIC:  Normal affect    Signed, Shirlee More, MD  06/13/2019 11:43 AM    [place]

## 2019-06-13 ENCOUNTER — Encounter: Payer: Self-pay | Admitting: *Deleted

## 2019-06-13 ENCOUNTER — Ambulatory Visit (INDEPENDENT_AMBULATORY_CARE_PROVIDER_SITE_OTHER): Payer: Medicare Other | Admitting: Cardiology

## 2019-06-13 ENCOUNTER — Other Ambulatory Visit: Payer: Self-pay

## 2019-06-13 ENCOUNTER — Encounter: Payer: Self-pay | Admitting: Cardiology

## 2019-06-13 VITALS — BP 114/70 | HR 61 | Ht 61.0 in | Wt 139.8 lb

## 2019-06-13 DIAGNOSIS — I493 Ventricular premature depolarization: Secondary | ICD-10-CM

## 2019-06-13 DIAGNOSIS — R9389 Abnormal findings on diagnostic imaging of other specified body structures: Secondary | ICD-10-CM

## 2019-06-13 DIAGNOSIS — R5383 Other fatigue: Secondary | ICD-10-CM | POA: Diagnosis not present

## 2019-06-13 DIAGNOSIS — R05 Cough: Secondary | ICD-10-CM | POA: Diagnosis not present

## 2019-06-13 DIAGNOSIS — R059 Cough, unspecified: Secondary | ICD-10-CM

## 2019-06-13 DIAGNOSIS — R079 Chest pain, unspecified: Secondary | ICD-10-CM

## 2019-06-13 HISTORY — DX: Abnormal findings on diagnostic imaging of other specified body structures: R93.89

## 2019-06-13 HISTORY — DX: Cough, unspecified: R05.9

## 2019-06-13 NOTE — Patient Instructions (Signed)
Medication Instructions:  Your physician recommends that you continue on your current medications as directed. Please refer to the Current Medication list given to you today.  If you need a refill on your cardiac medications before your next appointment, please call your pharmacy.   Lab work: Your physician recommends that you return for lab work today: TSH, T3, T4.   If you have labs (blood work) drawn today and your tests are completely normal, you will receive your results only by:  [place] (if you have MyChart) OR  A paper copy in the mail If you have any lab test that is abnormal or we need to change your treatment, we will call you to review the results.  Testing/Procedures: You had an EKG today.   Non-Cardiac CT scanning, (CAT scanning), is a noninvasive, special x-ray that produces cross-sectional images of the body using x-rays and a computer. CT scans help physicians diagnose and treat medical conditions. For some CT exams, a contrast material is used to enhance visibility in the area of the body being studied. CT scans provide greater clarity and reveal more details than regular x-ray exams.  Your physician has requested that you have a lexiscan myoview. For further information please visit [email or web address]. Please follow instruction sheet, as given.  Follow-Up: At [facility], you and your health needs are our priority.  As part of our continuing mission to provide you with exceptional heart care, we have created designated Provider Care Teams.  These Care Teams include your primary Cardiologist (physician) and Advanced Practice Providers (APPs -  Physician Assistants and Nurse Practitioners) who all work together to provide you with the care you need, when you need it. You will need a follow up appointment in 6 weeks.      Cardiac Nuclear Scan A cardiac nuclear scan is a test that is done to check the flow of blood to your heart. It is done when you are  resting and when you are exercising. The test looks for problems such as:  Not enough blood reaching a portion of the heart.  The heart muscle not working as it should. You may need this test if:  You have heart disease.  You have had lab results that are not normal.  You have had heart surgery or a balloon procedure to open up blocked arteries (angioplasty).  You have chest pain.  You have shortness of breath. In this test, a special dye (tracer) is put into your bloodstream. The tracer will travel to your heart. A camera will then take pictures of your heart to see how the tracer moves through your heart. This test is usually done at a hospital and takes 2-4 hours. Tell a doctor about:  Any allergies you have.  All medicines you are taking, including vitamins, herbs, eye drops, creams, and over-the-counter medicines.  Any problems you or family members have had with anesthetic medicines.  Any blood disorders you have.  Any surgeries you have had.  Any medical conditions you have.  Whether you are pregnant or may be pregnant. What are the risks? Generally, this is a safe test. However, problems may occur, such as:  Serious chest pain and heart attack. This is only a risk if the stress portion of the test is done.  Rapid heartbeat.  A feeling of warmth in your chest. This feeling usually does not last long.  Allergic reaction to the tracer. What happens before the test?  Ask your doctor about changing or stopping  your normal medicines. This is important.  Follow instructions from your doctor about what you cannot eat or drink.  Remove your jewelry on the day of the test. What happens during the test?  An IV tube will be inserted into one of your veins.  Your doctor will give you a small amount of tracer through the IV tube.  You will wait for 20-40 minutes while the tracer moves through your bloodstream.  Your heart will be monitored with an electrocardiogram  (ECG).  You will lie down on an exam table.  Pictures of your heart will be taken for about 15-20 minutes.  You may also have a stress test. For this test, one of these things may be done: ? You will be asked to exercise on a treadmill or a stationary bike. ? You will be given medicines that will make your heart work harder. This is done if you are unable to exercise.  When blood flow to your heart has peaked, a tracer will again be given through the IV tube.  After 20-40 minutes, you will get back on the exam table. More pictures will be taken of your heart.  Depending on the tracer that is used, more pictures may need to be taken 3-4 hours later.  Your IV tube will be removed when the test is over. The test may vary among doctors and hospitals. What happens after the test?  Ask your doctor: ? Whether you can return to your normal schedule, including diet, activities, and medicines. ? Whether you should drink more fluids. This will help to remove the tracer from your body. Drink enough fluid to keep your pee (urine) pale yellow.  Ask your doctor, or the department that is doing the test: ? When will my results be ready? ? How will I get my results? Summary  A cardiac nuclear scan is a test that is done to check the flow of blood to your heart.  Tell your doctor whether you are pregnant or may be pregnant.  Before the test, ask your doctor about changing or stopping your normal medicines. This is important.  Ask your doctor whether you can return to your normal activities. You may be asked to drink more fluids. This information is not intended to replace advice given to you by your health care provider. Make sure you discuss any questions you have with your health care provider. Document Released: 03/18/2018 Document Revised: 01/22/2019 Document Reviewed: 03/18/2018 Elsevier Patient Education  2020 [employer].

## 2019-06-14 LAB — TSH+T4F+T3FREE
Free T4: 0.97 ng/dL (ref 0.82–1.77)
T3, Free: 2.5 pg/mL (ref 2.0–4.4)
TSH: 1.28 u[IU]/mL (ref 0.450–4.500)

## 2019-07-09 ENCOUNTER — Telehealth (HOSPITAL_COMMUNITY): Payer: Self-pay | Admitting: *Deleted

## 2019-07-09 NOTE — Telephone Encounter (Signed)
Patient given detailed instructions per Myocardial Perfusion Study Information Sheet for the test on 07/15/19. Patient notified to arrive 15 minutes early and that it is imperative to arrive on time for appointment to keep from having the test rescheduled.  If you need to cancel or reschedule your appointment, please call the office within 24 hours of your appointment. . Patient verbalized understanding. Kirstie Peri

## 2019-07-15 ENCOUNTER — Other Ambulatory Visit: Payer: Self-pay

## 2019-07-15 ENCOUNTER — Ambulatory Visit (INDEPENDENT_AMBULATORY_CARE_PROVIDER_SITE_OTHER): Payer: Medicare Other

## 2019-07-15 DIAGNOSIS — R079 Chest pain, unspecified: Secondary | ICD-10-CM

## 2019-07-15 LAB — MYOCARDIAL PERFUSION IMAGING
LV dias vol: 85 mL (ref 46–106)
LV sys vol: 26 mL
Peak HR: 69 {beats}/min
Rest HR: 49 {beats}/min
SDS: 3
SRS: 1
SSS: 4
TID: 0.9

## 2019-07-15 MED ORDER — TECHNETIUM TC 99M TETROFOSMIN IV KIT
31.5000 | PACK | Freq: Once | INTRAVENOUS | Status: AC | PRN
  Administered 2019-07-15: 31.5 via INTRAVENOUS

## 2019-07-15 MED ORDER — REGADENOSON 0.4 MG/5ML IV SOLN
0.4000 mg | Freq: Once | INTRAVENOUS | Status: AC
  Administered 2019-07-15: 0.4 mg via INTRAVENOUS

## 2019-07-15 MED ORDER — TECHNETIUM TC 99M TETROFOSMIN IV KIT
10.7000 | PACK | Freq: Once | INTRAVENOUS | Status: AC | PRN
  Administered 2019-07-15: 10.7 via INTRAVENOUS

## 2019-07-28 ENCOUNTER — Telehealth: Payer: Self-pay | Admitting: *Deleted

## 2019-07-28 ENCOUNTER — Ambulatory Visit (INDEPENDENT_AMBULATORY_CARE_PROVIDER_SITE_OTHER): Payer: Medicare Other | Admitting: Cardiology

## 2019-07-28 ENCOUNTER — Encounter: Payer: Self-pay | Admitting: Cardiology

## 2019-07-28 ENCOUNTER — Other Ambulatory Visit: Payer: Self-pay

## 2019-07-28 VITALS — BP 130/68 | HR 54 | Ht 61.0 in | Wt 138.0 lb

## 2019-07-28 DIAGNOSIS — I10 Essential (primary) hypertension: Secondary | ICD-10-CM

## 2019-07-28 DIAGNOSIS — R9389 Abnormal findings on diagnostic imaging of other specified body structures: Secondary | ICD-10-CM

## 2019-07-28 DIAGNOSIS — E782 Mixed hyperlipidemia: Secondary | ICD-10-CM

## 2019-07-28 DIAGNOSIS — R9439 Abnormal result of other cardiovascular function study: Secondary | ICD-10-CM

## 2019-07-28 DIAGNOSIS — I25118 Atherosclerotic heart disease of native coronary artery with other forms of angina pectoris: Secondary | ICD-10-CM | POA: Diagnosis not present

## 2019-07-28 NOTE — Patient Instructions (Signed)
Medication Instructions:  Your physician recommends that you continue on your current medications as directed. Please refer to the Current Medication list given to you today.  If you need a refill on your cardiac medications before your next appointment, please call your pharmacy.   Lab work: None If you have labs (blood work) drawn today and your tests are completely normal, you will receive your results only by: [facility] MyChart Message (if you have MyChart) OR . A paper copy in the mail If you have any lab test that is abnormal or we need to change your treatment, we will call you to review the results.  Testing/Procedures: None  Follow-Up: At [facility], you and your health needs are our priority.  As part of our continuing mission to provide you with exceptional heart care, we have created designated Provider Care Teams.  These Care Teams include your primary Cardiologist (physician) and Advanced Practice Providers (APPs -  Physician Assistants and Nurse Practitioners) who all work together to provide you with the care you need, when you need it. You will need a follow up appointment in 2 weeks.  Please call our office 2 months in advance to schedule this appointment.  You may see Shirlee More, MD Any Other Special Instructions Will Be Listed Below (If Applicable).

## 2019-07-28 NOTE — Telephone Encounter (Signed)
Called patient and informed her on her CT chest w/o contrast results completed at [facility] on 07/22/2019. Patient states she is not having any shortness of breath and only "gets strangled once in a while when taking a bite of food." She does not think it necessary to schedule an appointment with the ENT that she is established with in [place], [place] at this time. Patient will call and schedule a follow up appointment if she feels it is needed. Patient is agreeable and verbalized understanding. No further questions.

## 2019-07-28 NOTE — Progress Notes (Signed)
Cardiology Office Note:    Date:  07/28/2019   ID:  Christine Rich, DOB May 19, 1934, MRN [id number]  PCP:  Street, Sharon Mt, MD  Cardiologist:  Shirlee More, MD  Electrophysiologist:  None   Referring MD: Street, Sharon Mt, *   Chief Complaint  Patient presents with  . Follow-up    CT    History of Present Illness:    Christine Rich is a 85 y.o. female with a hx of  CAD s/p CABG 2013 and NSTEMI 10/17/16 with PCi and DES to SVG to PDA EF 55-60%, DLD, HTN.   She did see Dr. Bettina Gavia in August 2020 at which time she had just been hospitalized at the [facility].  Her work-up there show normal cardiac exam and a normal echocardiogram.  She ruled out for Covid and subsequently discharged home.  During her recent with Dr. Bettina Gavia she did report episodes of angina.  Therefore at that time he recommended patient undergo a pharmacologic stress test.  In addition CT chest without contrast was also ordered.  In the interim the patient has had intermittent chest pain but however less frequently since she last saw Dr. Bettina Gavia.  He also did undergo the pharmacologic nuclear stress test as well as her CT chest without contrast.  Past Medical History:  Diagnosis Date  . Coronary artery disease involving native coronary artery of native heart 11/17/2015   Overview:  s/p CABGSx5 in 2003Lexiscan MPS in Aug 2011 without ischemia, EF 65% Cardiolite 10/29/12: CONCLUSIONS:  Study quality: fair, with obvious inferior attenuation artifact reducing the sensitivity of the test  (1) diaphragm attenuation artifact - prior non-transmural infarction cannot be excluded  (2) normal LV size, with ? inferior hypokinesis - measured LVEF of 48% may be an underestimate  (3) no areas of (vasodilator) stress-induced hypoperfusion are identified.  Negative perfusion stress test for potenital ischemia.   Cardiac catheterization January 2018 drug-eluting stent to graft going to the posterior descending  artery  . Essential hypertension 11/17/2015  . Hyperlipemia 11/17/2015  . Nontoxic multinodular goiter 08/14/2016  . 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 PVCs (premature ventricular contractions) 11/17/2015  . Vitamin B 12 deficiency     Past Surgical History:  Procedure Laterality Date  . ABDOMINAL HYSTERECTOMY    . CORONARY ARTERY BYPASS GRAFT    . MASTECTOMY      Current Medications: Current Meds  Medication Sig  . aspirin EC 81 MG tablet Take 81 mg by mouth daily.  . citalopram (CELEXA) 20 MG tablet 20 mg daily.  . clopidogrel (PLAVIX) 75 MG tablet Take 1 tablet (75 mg total) by mouth daily.  . isosorbide mononitrate (IMDUR) 30 MG 24 hr tablet Take 1 tablet (30 mg total) by mouth daily.  Marland Kitchen losartan (COZAAR) 25 MG tablet 25 mg daily.  . metoprolol tartrate (LOPRESSOR) 50 MG tablet Take 75 mg by mouth 2 (two) times daily.  . nitroGLYCERIN (NITROSTAT) 0.4 MG SL tablet Place 1 tablet (0.4 mg total) under the tongue every 5 (five) minutes as needed for chest pain.  . rosuvastatin (CRESTOR) 40 MG tablet Take 1 tablet (40 mg total) by mouth daily.     Allergies:   Patient has no known allergies.   Social History   Socioeconomic History  . Marital status: Married    Spouse name: Not on file  . Number of children: Not on file  . Years of education: Not on file  .  Highest education level: Not on file  Occupational History  . Not on file  Social Needs  . Financial resource strain: Not on file  . Food insecurity    Worry: Not on file    Inability: Not on file  . Transportation needs    Medical: Not on file    Non-medical: Not on file  Tobacco Use  . Smoking status: Never Smoker  . Smokeless tobacco: Never Used  Substance and Sexual Activity  . Alcohol use: No  . Drug use: No  . Sexual activity: Not on file  Lifestyle  . Physical activity    Days per week: Not on file    Minutes per session: Not on file  . Stress: Not on file  Relationships  .  Social Herbalist on phone: Not on file    Gets together: Not on file    Attends religious service: Not on file    Active member of club or organization: Not on file    Attends meetings of clubs or organizations: Not on file    Relationship status: Not on file  Other Topics Concern  . Not on file  Social History Narrative  . Not on file     Family History: The patient's family history includes Heart disease in her father and mother.  ROS:   Review of Systems  Constitution: Negative for decreased appetite, fever and weight gain.  HENT: Negative for congestion, ear discharge, hoarse voice and sore throat.   Eyes: Negative for discharge, redness, vision loss in right eye and visual halos.  Cardiovascular: Reports intermittent chest pain.  Negative for leg swelling, orthopnea and palpitations.  Respiratory: Negative for cough, hemoptysis, shortness of breath and snoring.   Endocrine: Negative for heat intolerance and polyphagia.  Hematologic/Lymphatic: Negative for bleeding problem. Does not bruise/bleed easily.  Skin: Negative for flushing, nail changes, rash and suspicious lesions.  Musculoskeletal: Negative for arthritis, joint pain, muscle cramps, myalgias, neck pain and stiffness.  Gastrointestinal: Negative for abdominal pain, bowel incontinence, diarrhea and excessive appetite.  Genitourinary: Negative for decreased libido, genital sores and incomplete emptying.  Neurological: Negative for brief paralysis, focal weakness, headaches and loss of balance.  Psychiatric/Behavioral: Negative for altered mental status, depression and suicidal ideas.  Allergic/Immunologic: Negative for HIV exposure and persistent infections.   EKGs/Labs/Other Studies Reviewed:    The following studies were reviewed today:  EKG:  None performed today.  Nuclear stress test July 06, 2019:  The left ventricular ejection fraction is hyperdynamic (>65%).  Nuclear stress EF: 70%.   There was no ST segment deviation noted during stress.  Defect 1: There is a medium defect of moderate severity present in the basal inferoseptal, basal inferior, mid inferoseptal and mid inferior location. This defect is reversible.  Findings consistent with ischemia.  This is a high risk study.    Recent Labs: 03/26/2019: ALT 15; BUN 12; Creatinine, Ser 0.87; Potassium 4.3; Sodium 141 06/13/2019: TSH 1.280  Recent Lipid Panel    Component Value Date/Time   CHOL 136 03/26/2019 0945   TRIG 102 03/26/2019 0945   HDL 44 03/26/2019 0945   CHOLHDL 3.1 03/26/2019 0945   LDLCALC 72 03/26/2019 0945    Physical Exam:    VS:  BP 130/68 (BP Location: Left Arm, Patient Position: Sitting, Cuff Size: Normal)   Pulse (!) 54   Ht 5\' 1"  (1.549 m)   Wt 138 lb (62.6 kg)   SpO2 97%   BMI 26.07 kg/m  Wt Readings from Last 3 Encounters:  07/28/19 138 lb (62.6 kg)  07/15/19 139 lb (63 kg)  06/13/19 139 lb 12.8 oz (63.4 kg)     GEN: Well nourished, well developed in no acute distress HEENT: Normal NECK: No JVD; No carotid bruits LYMPHATICS: No lymphadenopathy CARDIAC: S1S2 noted,RRR, no murmurs, rubs, gallops RESPIRATORY:  Clear to auscultation without rales, wheezing or rhonchi  ABDOMEN: Soft, non-tender, non-distended, +bowel sounds, no guarding. EXTREMITIES: No edema, No cyanosis, no clubbing MUSCULOSKELETAL:  No edema; No deformity  SKIN: Warm and dry NEUROLOGIC:  Alert and oriented x 3, non-focal PSYCHIATRIC:  Normal affect, good insight  ASSESSMENT:    1. Abnormal nuclear stress test   2. Abnormal CT scan, chest   3. Coronary artery disease of native artery of native heart with stable angina pectoris ([place])   4. Essential hypertension   5. Mixed hyperlipidemia    PLAN:     1.  The patient did have an abnormal stress test which reports reversible ischemia.  At this point moving forward the appropriate next step would be a left heart catheterization.  I have educated  patient about this seizure at this time she prefers to discuss this with Dr. Bettina Gavia who has been her long-term cardiologist -this is not unreasonable.  2.  For now she will continue her goal-directed medical therapy.  3.  I have advised patient if she had any recurrent/worsening symptoms to go to the ED to be further evaluated, and also to tell the physicians in the ED that she has recently had a abnormal nuclear stress test.  4.  Report from her CT chest without contrast performed at [facility] were discussed with patient.  There is evidence of a thyroid goiter which appears to be known to the patient since 2 years ago.  The patient is in agreement with the above plan. The patient left the office in stable condition.  The patient will follow up in 1 week with Dr. Bettina Gavia.   Medication Adjustments/Labs and Tests Ordered: Current medicines are reviewed at length with the patient today.  Concerns regarding medicines are outlined above.  No orders of the defined types were placed in this encounter.  No orders of the defined types were placed in this encounter.   Patient Instructions  Medication Instructions:  Your physician recommends that you continue on your current medications as directed. Please refer to the Current Medication list given to you today.  If you need a refill on your cardiac medications before your next appointment, please call your pharmacy.   Lab work: None If you have labs (blood work) drawn today and your tests are completely normal, you will receive your results only by: Marland Kitchen MyChart Message (if you have MyChart) OR . A paper copy in the mail If you have any lab test that is abnormal or we need to change your treatment, we will call you to review the results.  Testing/Procedures: None  Follow-Up: At [facility], you and your health needs are our priority.  As part of our continuing mission to provide you with exceptional heart care, we have created  designated Provider Care Teams.  These Care Teams include your primary Cardiologist (physician) and Advanced Practice Providers (APPs -  Physician Assistants and Nurse Practitioners) who all work together to provide you with the care you need, when you need it. You will need a follow up appointment in 2 weeks.  Please call our office 2 months in advance to schedule this  appointment.  You may see Shirlee More, MD Any Other Special Instructions Will Be Listed Below (If Applicable).       Adopting a Healthy Lifestyle.  Know what a healthy weight is for you (roughly BMI <25) and aim to maintain this   Aim for 7+ servings of fruits and vegetables daily   65-80+ fluid ounces of water or unsweet tea for healthy kidneys   Limit to max 1 drink of alcohol per day; avoid smoking/tobacco   Limit animal fats in diet for cholesterol and heart health - choose grass fed whenever available   Avoid highly processed foods, and foods high in saturated/trans fats   Aim for low stress - take time to unwind and care for your mental health   Aim for 150 min of moderate intensity exercise weekly for heart health, and weights twice weekly for bone health   Aim for 7-9 hours of sleep daily   When it comes to diets, agreement about the perfect plan isnt easy to find, even among the experts. Experts at the [employer] developed an idea known as the Healthy Eating Plate. Just imagine a plate divided into logical, healthy portions.   The emphasis is on diet quality:   Load up on vegetables and fruits - one-half of your plate: Aim for color and variety, and remember that potatoes dont count.   Go for whole grains - one-quarter of your plate: Whole wheat, barley, wheat berries, quinoa, oats, brown rice, and foods made with them. If you want pasta, go with whole wheat pasta.   Protein power - one-quarter of your plate: Fish, chicken, beans, and nuts are all healthy, versatile protein  sources. Limit red meat.   The diet, however, does go beyond the plate, offering a few other suggestions.   Use healthy plant oils, such as olive, canola, soy, corn, sunflower and peanut. Check the labels, and avoid partially hydrogenated oil, which have unhealthy trans fats.   If youre thirsty, drink water. Coffee and tea are good in moderation, but skip sugary drinks and limit milk and dairy products to one or two daily servings.   The type of carbohydrate in the diet is more important than the amount. Some sources of carbohydrates, such as vegetables, fruits, whole grains, and beans-are healthier than others.   Finally, stay active  Signed, Berniece Salines, DO  07/28/2019 2:28 PM    [facility]

## 2019-08-06 ENCOUNTER — Ambulatory Visit: Payer: Medicare Other | Admitting: Cardiology

## 2019-08-06 NOTE — Progress Notes (Signed)
Cardiology Office Note:    Date:  08/07/2019   ID:  Christine Rich, DOB [birth year], MRN [id number]  PCP:  Street, Sharon Mt, MD  Cardiologist:  Shirlee More, MD    Referring MD: [address], Sharon Mt, *    ASSESSMENT:    1. Coronary artery disease of native artery of native heart with stable angina pectoris ([place])   2. Essential hypertension   3. Mixed hyperlipidemia    PLAN:    In order of problems listed above:  1. Stable CAD on current medical treatment continue dual antiplatelet beta-blocker oral nitrates and high intensity statin 2. Stable continue current therapy including ARB with CAD 3. Tolerates and continue her statin   Next appointment: 3  months   Medication Adjustments/Labs and Tests Ordered: Current medicines are reviewed at length with the patient today.  Concerns regarding medicines are outlined above.  No orders of the defined types were placed in this encounter.  No orders of the defined types were placed in this encounter.   No chief complaint on file.   History of Present Illness:    Christine Rich is a 85 y.o. female with a hx of CAD s/p CABG 2013 and NSTEMI 10/17/16 with PCi and DES to SVG to PDA EF 55-60%, DLD, HTN.  last seen 07/28/2019 with an abnormal MPI.Marland Kitchen Compliance with diet, lifestyle and medications: Yes I had the opportunity to  review her testing with the patient fortunately her CAD is stable.  She is having no angina is pleased with the quality of his life tolerates and compliant with medications and reviewed the option of ongoing medical therapy for chronic stable CAD versus intervention and further revascularization.  Worried about her risk we decided to continue medical therapy and follow-up closely every 3 months if she develops limiting angina or ACS proceed with coronary angiography and PCI.  She is relieved  07/15/2019: Nuclear Study Quality Overall image quality is excellent.  There is no nuclear artifact present.  Nuclear  Measurements Study was gated.  Rest Perfusion Rest perfusion normal.  Stress Perfusion Stress perfusion abnormal.   There is a defect present in the basal inferoseptal, basal inferior, mid inferoseptal and mid inferior location.  Perfusion Summary Defect 1:  There is a medium defect of moderate severity present in the basal inferoseptal, basal inferior, mid inferoseptal and mid inferior location. The defect is reversible.  Overall Study Impression Myocardial perfusion is abnormal.  Findings consistent with ischemia.  This is a high risk study.  Overall left ventricular systolic function was normal.    Nuclear stress EF:  70%.  The left ventricular ejection fraction is hyperdynamic (>65%).     Left heart cath 10/17/2016: Conclusions Diagnostic Summary Severe stenosis of the LAD, Circumflex Chronic Total Occlusion of the RCA, Ramus Patent Free LIMA graft to the mid LAD. Patent SVG graft to the 1st OM & Ramus arteries. Severe disease of SVG graft to the Posterior descending coronary artery. LV not done Interventional Summary Successful PCI / Drug Eluting Stent of the ostial and mid SVG to the PDA  Study Highlights  A ZIO monitor was performed 6 days 23 hours beginning 05/20/2019 to assess symptomatic PVCs.  The rhythm throughout was sinus with minimum average and maximum heart rates of 44, 69 and 117 bpm.  The minimum rate was sinus bradycardia.  There was 1 single triggered event with frequent PVCs.  There were no pauses of 3 seconds or greater and no episodes of sinus node or  AV nodal block.  Ventricular arrhythmia was occasional PVCs rare 181 couplets and rare triplets 21.  There was 1 episode of nonsustained ventricular tachycardia 9 complexes in a relatively slow rate of 139 bpm.  Supraventricular arrhythmia was rare with APCs.  There are no episodes of atrial fibrillation or flutter.  18 brief episodes of atrial tachycardia were present the longest 14 complexes at a rate of 96  bpm and the fastest 4 complexes at a rate of 148 bpm.   Conclusion overall  occasional ventricular and rare supraventricular arrhythmia with a single triggered event showing frequent PVCs and one 9 beat run of nonsustained ventricular tachycardia.     Past Medical History:  Diagnosis Date  . Coronary artery disease involving native coronary artery of native heart 11/17/2015   Overview:  s/p CABGSx5 in 2003Lexiscan MPS in Aug 2011 without ischemia, EF 65% Cardiolite 10/29/12: CONCLUSIONS:  Study quality: fair, with obvious inferior attenuation artifact reducing the sensitivity of the test  (1) diaphragm attenuation artifact - prior non-transmural infarction cannot be excluded  (2) normal LV size, with ? inferior hypokinesis - measured LVEF of 48% may be an underestimate  (3) no areas of (vasodilator) stress-induced hypoperfusion are identified.  Negative perfusion stress test for potenital ischemia.   Cardiac catheterization January 2018 drug-eluting stent to graft going to the posterior descending artery  . Essential hypertension 11/17/2015  . Hyperlipemia 11/17/2015  . Nontoxic multinodular goiter 08/14/2016  . 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 PVCs (premature ventricular contractions) 11/17/2015  . Vitamin B 12 deficiency     Past Surgical History:  Procedure Laterality Date  . ABDOMINAL HYSTERECTOMY    . CORONARY ARTERY BYPASS GRAFT    . MASTECTOMY      Current Medications: Current Meds  Medication Sig  . aspirin EC 81 MG tablet Take 81 mg by mouth daily.  . citalopram (CELEXA) 20 MG tablet 20 mg daily.  . clopidogrel (PLAVIX) 75 MG tablet Take 1 tablet (75 mg total) by mouth daily.  . isosorbide mononitrate (IMDUR) 30 MG 24 hr tablet Take 1 tablet (30 mg total) by mouth daily.  Marland Kitchen losartan (COZAAR) 25 MG tablet 25 mg daily.  . metoprolol tartrate (LOPRESSOR) 50 MG tablet Take 75 mg by mouth 2 (two) times daily.  . nitroGLYCERIN (NITROSTAT) 0.4 MG SL  tablet Place 1 tablet (0.4 mg total) under the tongue every 5 (five) minutes as needed for chest pain.  . rosuvastatin (CRESTOR) 40 MG tablet Take 1 tablet (40 mg total) by mouth daily.     Allergies:   Patient has no known allergies.   Social History   Socioeconomic History  . Marital status: Married    Spouse name: Not on file  . Number of children: Not on file  . Years of education: Not on file  . Highest education level: Not on file  Occupational History  . Not on file  Social Needs  . Financial resource strain: Not on file  . Food insecurity    Worry: Not on file    Inability: Not on file  . Transportation needs    Medical: Not on file    Non-medical: Not on file  Tobacco Use  . Smoking status: Never Smoker  . Smokeless tobacco: Never Used  Substance and Sexual Activity  . Alcohol use: No  . Drug use: No  . Sexual activity: Not on file  Lifestyle  . Physical activity    Days per week: Not  on file    Minutes per session: Not on file  . Stress: Not on file  Relationships  . Social Herbalist on phone: Not on file    Gets together: Not on file    Attends religious service: Not on file    Active member of club or organization: Not on file    Attends meetings of clubs or organizations: Not on file    Relationship status: Not on file  Other Topics Concern  . Not on file  Social History Narrative  . Not on file     Family History: The patient's family history includes Heart disease in her father and mother. ROS:   Please see the history of present illness.    All other systems reviewed and are negative.  EKGs/Labs/Other Studies Reviewed:    The following studies were reviewed today:  EKG today sinus bradycardia first-degree AV block nonspecific T waves single APCs present Recent Labs: 03/26/2019: ALT 15; BUN 12; Creatinine, Ser 0.87; Potassium 4.3; Sodium 141 06/13/2019: TSH 1.280  Recent Lipid Panel    Component Value Date/Time   CHOL 136  03/26/2019 0945   TRIG 102 03/26/2019 0945   HDL 44 03/26/2019 0945   CHOLHDL 3.1 03/26/2019 0945   LDLCALC 72 03/26/2019 0945    Physical Exam:    VS:  BP 136/70 (BP Location: Right Arm, Patient Position: Sitting, Cuff Size: Normal)   Pulse (!) 48   Ht 5\' 1"  (1.549 m)   Wt 135 lb 12.8 oz (61.6 kg)   SpO2 94%   BMI 25.66 kg/m     Wt Readings from Last 3 Encounters:  08/07/19 135 lb 12.8 oz (61.6 kg)  07/28/19 138 lb (62.6 kg)  07/15/19 139 lb (63 kg)     GEN:  Well nourished, well developed in no acute distress HEENT: Normal NECK: No JVD; No carotid bruits LYMPHATICS: No lymphadenopathy CARDIAC: RRR, no murmurs, rubs, gallops RESPIRATORY:  Clear to auscultation without rales, wheezing or rhonchi  ABDOMEN: Soft, non-tender, non-distended MUSCULOSKELETAL:  No edema; No deformity  SKIN: Warm and dry NEUROLOGIC:  Alert and oriented x 3 PSYCHIATRIC:  Normal affect    Signed, Shirlee More, MD  08/07/2019 9:35 AM    [place]

## 2019-08-07 ENCOUNTER — Ambulatory Visit (INDEPENDENT_AMBULATORY_CARE_PROVIDER_SITE_OTHER): Payer: Medicare Other | Admitting: Cardiology

## 2019-08-07 ENCOUNTER — Other Ambulatory Visit: Payer: Self-pay

## 2019-08-07 VITALS — BP 136/70 | HR 48 | Ht 61.0 in | Wt 135.8 lb

## 2019-08-07 DIAGNOSIS — E782 Mixed hyperlipidemia: Secondary | ICD-10-CM

## 2019-08-07 DIAGNOSIS — I10 Essential (primary) hypertension: Secondary | ICD-10-CM | POA: Diagnosis not present

## 2019-08-07 DIAGNOSIS — I25118 Atherosclerotic heart disease of native coronary artery with other forms of angina pectoris: Secondary | ICD-10-CM | POA: Diagnosis not present

## 2019-08-07 NOTE — Patient Instructions (Signed)
Medication Instructions:  Your physician recommends that you continue on your current medications as directed. Please refer to the Current Medication list given to you today.  *If you need a refill on your cardiac medications before your next appointment, please call your pharmacy*  Lab Work: None  If you have labs (blood work) drawn today and your tests are completely normal, you will receive your results only by: . MyChart Message (if you have MyChart) OR . A paper copy in the mail If you have any lab test that is abnormal or we need to change your treatment, we will call you to review the results.  Testing/Procedures: You had an EKG today.   Follow-Up: At [facility], you and your health needs are our priority.  As part of our continuing mission to provide you with exceptional heart care, we have created designated Provider Care Teams.  These Care Teams include your primary Cardiologist (physician) and Advanced Practice Providers (APPs -  Physician Assistants and Nurse Practitioners) who all work together to provide you with the care you need, when you need it.  Your next appointment:   3 months  The format for your next appointment:   In Person  Provider:   Brian Munley, MD  

## 2019-09-28 NOTE — Progress Notes (Signed)
Cardiology Office Note:    Date:  09/29/2019   ID:  Christine Rich, DOB [birth year], MRN [id number]  PCP:  Street, Sharon Mt, MD  Cardiologist:  Shirlee More, MD    Referring MD: [address], Sharon Mt, *    ASSESSMENT:    1. Coronary artery disease of native artery of native heart with stable angina pectoris ([place])   2. Essential hypertension   3. Mixed hyperlipidemia    PLAN:    In order of problems listed above:  1. Stable CAD presently having no angina New York Heart Association class I and continue treatment including dual antiplatelet oral nitrate beta-blocker and her statin.  At this time I would not advise angiography 2. Stable continue current treatment 3. Table continue her statin recheck lipids and liver function efficacy and safety   Next appointment: 3 months   Medication Adjustments/Labs and Tests Ordered: Current medicines are reviewed at length with the patient today.  Concerns regarding medicines are outlined above.  No orders of the defined types were placed in this encounter.  No orders of the defined types were placed in this encounter.   Chief Complaint  Patient presents with  . Follow-up  . Coronary Artery Disease    History of Present Illness:    Christine Rich is a 85 y.o. female with a hx of CAD s/p CABG 2013 and NSTEMI 10/17/16 with PCi and DES to SVG to PDA EF 55-60%, DLD, HTN.  last seen 07/28/2019 with an abnormal MPI.  Clinically I suspect that she had interval occlusion the saphenous vein graft to the posterior descending artery prompting her hospitalization with unstable angina pectoris 07/06/2019 [facility]. She was last seen 08/07/2019 and was having no angina on her current medical treatment at that time. Compliance with diet, lifestyle and medications: Yes  07/15/2019: Nuclear Study Quality Overall image quality is excellent. There is no nuclear artifact present.  Nuclear Measurements Study was gated.  Rest Perfusion Rest  perfusion normal.  Stress Perfusion Stress perfusion abnormal. There is a defect present in the basal inferoseptal, basal inferior, mid inferoseptal and mid inferior location.  Perfusion Summary Defect 1:  There is a medium defect of moderate severity present in the basal inferoseptal, basal inferior, mid inferoseptal and mid inferior location. The defect is reversible.  Overall Study Impression Myocardial perfusion is abnormal. Findings consistent with ischemia. This is a high risk study. Overall left ventricular systolic function was normal. Nuclear stress EF: 70%. The left ventricular ejection fraction is hyperdynamic (>65%).    Left heart cath 10/17/2016: Conclusions Diagnostic Summary Severe stenosis of the LAD, Circumflex Chronic Total Occlusion of the RCA, Ramus Patent Free LIMA graft to the mid LAD. Patent SVG graft to the 1st OM & Ramus arteries. Severe disease of SVG graft to the Posterior descending coronary artery. LV not done Interventional Summary Successful PCI / Drug Eluting Stent of the ostial and mid SVG to the PDA  She is more confident tolerates medical treatment has had no interval angina.  At this time I would not advise elective coronary angiography and revascularization.  She has no muscle symptoms or statin weakness or pain has had no bleeding complication from her dual antiplatelet.  No chest pain shortness of breath palpitation or syncope.  Did not repeat an EKG today Past Medical History:  Diagnosis Date  . Coronary artery disease involving native coronary artery of native heart 11/17/2015   Overview:  s/p CABGSx5 in 2003Lexiscan MPS in Aug 2011 without  ischemia, EF 65% Cardiolite 10/29/12: CONCLUSIONS:  Study quality: fair, with obvious inferior attenuation artifact reducing the sensitivity of the test  (1) diaphragm attenuation artifact - prior non-transmural infarction cannot be excluded  (2) normal LV size, with ? inferior hypokinesis - measured LVEF  of 48% may be an underestimate  (3) no areas of (vasodilator) stress-induced hypoperfusion are identified.  Negative perfusion stress test for potenital ischemia.   Cardiac catheterization January 2018 drug-eluting stent to graft going to the posterior descending artery  . Essential hypertension 11/17/2015  . Hyperlipemia 11/17/2015  . Nontoxic multinodular goiter 08/14/2016  . 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 PVCs (premature ventricular contractions) 11/17/2015  . Vitamin B 12 deficiency     Past Surgical History:  Procedure Laterality Date  . ABDOMINAL HYSTERECTOMY    . CORONARY ARTERY BYPASS GRAFT    . MASTECTOMY      Current Medications: Current Meds  Medication Sig  . aspirin EC 81 MG tablet Take 81 mg by mouth daily.  . citalopram (CELEXA) 20 MG tablet 20 mg daily.  . clopidogrel (PLAVIX) 75 MG tablet Take 1 tablet (75 mg total) by mouth daily.  . isosorbide mononitrate (IMDUR) 30 MG 24 hr tablet Take 1 tablet (30 mg total) by mouth daily.  Marland Kitchen losartan (COZAAR) 25 MG tablet 25 mg daily.  . metoprolol tartrate (LOPRESSOR) 50 MG tablet Take 75 mg by mouth 2 (two) times daily.  . nitroGLYCERIN (NITROSTAT) 0.4 MG SL tablet Place 1 tablet (0.4 mg total) under the tongue every 5 (five) minutes as needed for chest pain.  . rosuvastatin (CRESTOR) 40 MG tablet Take 1 tablet (40 mg total) by mouth daily.     Allergies:   Patient has no known allergies.   Social History   Socioeconomic History  . Marital status: Married    Spouse name: Not on file  . Number of children: Not on file  . Years of education: Not on file  . Highest education level: Not on file  Occupational History  . Not on file  Tobacco Use  . Smoking status: Never Smoker  . Smokeless tobacco: Never Used  Substance and Sexual Activity  . Alcohol use: No  . Drug use: No  . Sexual activity: Not on file  Other Topics Concern  . Not on file  Social History Narrative  . Not on file    Social Determinants of Health   Financial Resource Strain:   . Difficulty of Paying Living Expenses: Not on file  Food Insecurity:   . Worried About Charity fundraiser in the Last Year: Not on file  . Ran Out of Food in the Last Year: Not on file  Transportation Needs:   . Lack of Transportation (Medical): Not on file  . Lack of Transportation (Non-Medical): Not on file  Physical Activity:   . Days of Exercise per Week: Not on file  . Minutes of Exercise per Session: Not on file  Stress:   . Feeling of Stress : Not on file  Social Connections:   . Frequency of Communication with Friends and Family: Not on file  . Frequency of Social Gatherings with Friends and Family: Not on file  . Attends Religious Services: Not on file  . Active Member of Clubs or Organizations: Not on file  . Attends Archivist Meetings: Not on file  . Marital Status: Not on file     Family History: The patient's family history includes  Heart disease in her father and mother. ROS:   Please see the history of present illness.    All other systems reviewed and are negative.  EKGs/Labs/Other Studies Reviewed:    The following studies were reviewed today:   Recent Labs: 03/26/2019: ALT 15; BUN 12; Creatinine, Ser 0.87; Potassium 4.3; Sodium 141 06/13/2019: TSH 1.280  Recent Lipid Panel    Component Value Date/Time   CHOL 136 03/26/2019 0945   TRIG 102 03/26/2019 0945   HDL 44 03/26/2019 0945   CHOLHDL 3.1 03/26/2019 0945   LDLCALC 72 03/26/2019 0945    Physical Exam:    VS:  BP 120/62   Pulse (!) 56   Ht 5\' 1"  (1.549 m)   Wt 140 lb (63.5 kg)   BMI 26.45 kg/m     Wt Readings from Last 3 Encounters:  09/29/19 140 lb (63.5 kg)  08/07/19 135 lb 12.8 oz (61.6 kg)  07/28/19 138 lb (62.6 kg)     GEN:  Well nourished, well developed in no acute distress HEENT: Normal NECK: No JVD; No carotid bruits LYMPHATICS: No lymphadenopathy CARDIAC: RRR, no murmurs, rubs,  gallops RESPIRATORY:  Clear to auscultation without rales, wheezing or rhonchi  ABDOMEN: Soft, non-tender, non-distended MUSCULOSKELETAL:  No edema; No deformity  SKIN: Warm and dry NEUROLOGIC:  Alert and oriented x 3 PSYCHIATRIC:  Normal affect    Signed, Shirlee More, MD  09/29/2019 10:05 AM    [place]

## 2019-09-29 ENCOUNTER — Ambulatory Visit (INDEPENDENT_AMBULATORY_CARE_PROVIDER_SITE_OTHER): Payer: Medicare Other | Admitting: Cardiology

## 2019-09-29 ENCOUNTER — Other Ambulatory Visit: Payer: Self-pay

## 2019-09-29 ENCOUNTER — Encounter: Payer: Self-pay | Admitting: Cardiology

## 2019-09-29 VITALS — BP 120/62 | HR 56 | Ht 61.0 in | Wt 140.0 lb

## 2019-09-29 DIAGNOSIS — I10 Essential (primary) hypertension: Secondary | ICD-10-CM

## 2019-09-29 DIAGNOSIS — I25118 Atherosclerotic heart disease of native coronary artery with other forms of angina pectoris: Secondary | ICD-10-CM

## 2019-09-29 DIAGNOSIS — E782 Mixed hyperlipidemia: Secondary | ICD-10-CM

## 2019-09-29 NOTE — Patient Instructions (Signed)
Medication Instructions:  Your physician recommends that you continue on your current medications as directed. Please refer to the Current Medication list given to you today.  *If you need a refill on your cardiac medications before your next appointment, please call your pharmacy*  Lab Work: Your physician recommends that you return for lab work today: CMP, lipid panel.  If you have labs (blood work) drawn today and your tests are completely normal, you will receive your results only by: Marland Kitchen MyChart Message (if you have MyChart) OR . A paper copy in the mail If you have any lab test that is abnormal or we need to change your treatment, we will call you to review the results.  Testing/Procedures: None  Follow-Up: At [facility], you and your health needs are our priority.  As part of our continuing mission to provide you with exceptional heart care, we have created designated Provider Care Teams.  These Care Teams include your primary Cardiologist (physician) and Advanced Practice Providers (APPs -  Physician Assistants and Nurse Practitioners) who all work together to provide you with the care you need, when you need it.  Your next appointment:   3 month(s)  The format for your next appointment:   In Person  Provider:   Shirlee More, MD

## 2019-09-29 NOTE — Addendum Note (Signed)
Addended by: Austin Miles on: 09/29/2019 10:08 AM   Modules accepted: Orders

## 2019-09-30 LAB — COMPREHENSIVE METABOLIC PANEL
ALT: 12 IU/L (ref 0–32)
AST: 18 IU/L (ref 0–40)
Albumin/Globulin Ratio: 2.1 (ref 1.2–2.2)
Albumin: 4.2 g/dL (ref 3.6–4.6)
Alkaline Phosphatase: 72 IU/L (ref 39–117)
BUN/Creatinine Ratio: 12 (ref 12–28)
BUN: 11 mg/dL (ref 8–27)
Bilirubin Total: 0.4 mg/dL (ref 0.0–1.2)
CO2: 25 mmol/L (ref 20–29)
Calcium: 9.3 mg/dL (ref 8.7–10.3)
Chloride: 103 mmol/L (ref 96–106)
Creatinine, Ser: 0.89 mg/dL (ref 0.57–1.00)
GFR calc Af Amer: 69 mL/min/{1.73_m2} (ref >59)
GFR calc non Af Amer: 60 mL/min/{1.73_m2} (ref >59)
Globulin, Total: 2 g/dL (ref 1.5–4.5)
Glucose: 91 mg/dL (ref 65–99)
Potassium: 3.9 mmol/L (ref 3.5–5.2)
Sodium: 142 mmol/L (ref 134–144)
Total Protein: 6.2 g/dL (ref 6.0–8.5)

## 2019-09-30 LAB — LIPID PANEL
Chol/HDL Ratio: 3.3 ratio (ref 0.0–4.4)
Cholesterol, Total: 143 mg/dL (ref 100–199)
HDL: 43 mg/dL (ref >39)
LDL Chol Calc (NIH): 74 mg/dL (ref 0–99)
Triglycerides: 148 mg/dL (ref 0–149)
VLDL Cholesterol Cal: 26 mg/dL (ref 5–40)

## 2019-10-06 ENCOUNTER — Telehealth: Payer: Medicare Other | Admitting: Cardiology

## 2019-11-27 DIAGNOSIS — R202 Paresthesia of skin: Secondary | ICD-10-CM

## 2019-11-27 HISTORY — DX: Paresthesia of skin: R20.2

## 2019-12-03 ENCOUNTER — Other Ambulatory Visit: Payer: Self-pay | Admitting: Cardiology

## 2020-01-04 NOTE — Progress Notes (Signed)
Cardiology Office Note:    Date:  01/05/2020   ID:  Christine Rich, DOB [birth year], MRN [id number]  PCP:  Street, Sharon Mt, MD  Cardiologist:  Shirlee More, MD    Referring MD: [address], Sharon Mt, *    ASSESSMENT:    1. Coronary artery disease of native artery of native heart with stable angina pectoris ([place])   2. Essential hypertension   3. Mixed hyperlipidemia    PLAN:    In order of problems listed above:  1. Stable CAD New York Heart Association class I asymptomatic and continue her current intense medical therapy including dual antiplatelet lipid-lowering beta-blocker oral nitrates. 2. BP at target continue current treatment 3. Continue her statin.  Most recent lipid profile 09/29/2019 cholesterol 143 HDL 43 LDL 72   Next appointment: 6 months   Medication Adjustments/Labs and Tests Ordered: Current medicines are reviewed at length with the patient today.  Concerns regarding medicines are outlined above.  No orders of the defined types were placed in this encounter.  No orders of the defined types were placed in this encounter.   No chief complaint on file.   History of Present Illness:    Christine Rich is a 86 y.o. female with a hx of CAD s/p CABG 2013 and NSTEMI 10/17/16 with PCi and DES to SVG to PDA EF 55-60%, DLD, HTN.  last seen 07/28/2019 with an abnormal MPI.  Clinically I suspected that she had interval occlusion the saphenous vein graft to the posterior descending artery prompting her hospitalization with unstable angina pectoris 07/06/2019 [facility].  She was last seen 09/28/2020. Compliance with diet, lifestyle and medications: Yes  She is doing well she has had no anginal discomfort has had follow-up with ENT surgery.  No chest pain shortness of breath palpitation or syncope. Past Medical History:  Diagnosis Date  . Abnormal CXR 06/13/2019  . Coronary artery disease involving native coronary artery of native heart 11/17/2015   Overview:   s/p CABGSx5 in 2003Lexiscan MPS in Aug 2011 without ischemia, EF 65% Cardiolite 10/29/12: CONCLUSIONS:  Study quality: fair, with obvious inferior attenuation artifact reducing the sensitivity of the test  (1) diaphragm attenuation artifact - prior non-transmural infarction cannot be excluded  (2) normal LV size, with ? inferior hypokinesis - measured LVEF of 48% may be an underestimate  (3) no areas of (vasodilator) stress-induced hypoperfusion are identified.  Negative perfusion stress test for potenital ischemia.   Cardiac catheterization January 2018 drug-eluting stent to graft going to the posterior descending artery  . Cough 06/13/2019  . Essential hypertension 11/17/2015  . Hyperlipemia 11/17/2015  . Malignant neoplasm of left female breast ([place]) 01/23/2018  . Nontoxic multinodular goiter 08/14/2016  . NSTEMI (non-ST elevated myocardial infarction) ([place]) 10/18/2016  . Posterior neck pain 01/23/2018  . 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 PVCs (premature ventricular contractions) 11/17/2015  . Spondylosis of cervical region without myelopathy or radiculopathy 01/23/2018  . Vitamin B 12 deficiency     Past Surgical History:  Procedure Laterality Date  . ABDOMINAL HYSTERECTOMY    . CORONARY ARTERY BYPASS GRAFT    . MASTECTOMY      Current Medications: Current Meds  Medication Sig  . aspirin EC 81 MG tablet Take 81 mg by mouth daily.  . citalopram (CELEXA) 20 MG tablet Take 20 mg by mouth daily.   . clopidogrel (PLAVIX) 75 MG tablet Take 1 tablet (75 mg total) by mouth daily.  . isosorbide  mononitrate (IMDUR) 30 MG 24 hr tablet Take 1 tablet (30 mg total) by mouth daily.  Marland Kitchen losartan (COZAAR) 25 MG tablet Take 25 mg by mouth daily.   . metoprolol tartrate (LOPRESSOR) 50 MG tablet Take 75 mg by mouth 2 (two) times daily.  . nitroGLYCERIN (NITROSTAT) 0.4 MG SL tablet PLACE 1 TABLET UNDER THE TONGUE EVERY 5 MINUTES FOR UP TO 3 DOSES AS NEEDED FOR CHEST PAIN. CALL 911 IF  PAIN PERSISTS.  Marland Kitchen rosuvastatin (CRESTOR) 40 MG tablet Take 1 tablet (40 mg total) by mouth daily.     Allergies:   Patient has no known allergies.   Social History   Socioeconomic History  . Marital status: Married    Spouse name: Not on file  . Number of children: Not on file  . Years of education: Not on file  . Highest education level: Not on file  Occupational History  . Not on file  Tobacco Use  . Smoking status: Never Smoker  . Smokeless tobacco: Never Used  Substance and Sexual Activity  . Alcohol use: No  . Drug use: No  . Sexual activity: Not on file  Other Topics Concern  . Not on file  Social History Narrative  . Not on file   Social Determinants of Health   Financial Resource Strain:   . Difficulty of Paying Living Expenses:   Food Insecurity:   . Worried About Charity fundraiser in the Last Year:   . Arboriculturist in the Last Year:   Transportation Needs:   . Film/video editor (Medical):   Marland Kitchen Lack of Transportation (Non-Medical):   Physical Activity:   . Days of Exercise per Week:   . Minutes of Exercise per Session:   Stress:   . Feeling of Stress :   Social Connections:   . Frequency of Communication with Friends and Family:   . Frequency of Social Gatherings with Friends and Family:   . Attends Religious Services:   . Active Member of Clubs or Organizations:   . Attends Archivist Meetings:   Marland Kitchen Marital Status:      Family History: The patient's family history includes Heart disease in her father and mother. ROS:   Please see the history of present illness.    All other systems reviewed and are negative.  EKGs/Labs/Other Studies Reviewed:    The following studies were reviewed today:    Recent Labs: 06/13/2019: TSH 1.280 09/29/2019: ALT 12; BUN 11; Creatinine, Ser 0.89; Potassium 3.9; Sodium 142  Recent Lipid Panel    Component Value Date/Time   CHOL 143 09/29/2019 1014   TRIG 148 09/29/2019 1014   HDL 43  09/29/2019 1014   CHOLHDL 3.3 09/29/2019 1014   LDLCALC 74 09/29/2019 1014    Physical Exam:    VS:  BP 140/64   Pulse (!) 57   Ht 5\' 1"  (1.549 m)   Wt 143 lb (64.9 kg)   SpO2 96%   BMI 27.02 kg/m     Wt Readings from Last 3 Encounters:  01/05/20 143 lb (64.9 kg)  09/29/19 140 lb (63.5 kg)  08/07/19 135 lb 12.8 oz (61.6 kg)     GEN:  Well nourished, well developed in no acute distress HEENT: Normal NECK: No JVD; No carotid bruits LYMPHATICS: No lymphadenopathy CARDIAC: RRR, no murmurs, rubs, gallops RESPIRATORY:  Clear to auscultation without rales, wheezing or rhonchi  ABDOMEN: Soft, non-tender, non-distended MUSCULOSKELETAL:  No edema; No deformity  SKIN: Warm and dry NEUROLOGIC:  Alert and oriented x 3 PSYCHIATRIC:  Normal affect    Signed, Shirlee More, MD  01/05/2020 10:12 AM    [place]

## 2020-01-05 ENCOUNTER — Ambulatory Visit (INDEPENDENT_AMBULATORY_CARE_PROVIDER_SITE_OTHER): Payer: Medicare PPO | Admitting: Cardiology

## 2020-01-05 ENCOUNTER — Other Ambulatory Visit: Payer: Self-pay

## 2020-01-05 ENCOUNTER — Encounter: Payer: Self-pay | Admitting: Cardiology

## 2020-01-05 VITALS — BP 140/64 | HR 57 | Ht 61.0 in | Wt 143.0 lb

## 2020-01-05 DIAGNOSIS — I25118 Atherosclerotic heart disease of native coronary artery with other forms of angina pectoris: Secondary | ICD-10-CM | POA: Diagnosis not present

## 2020-01-05 DIAGNOSIS — E782 Mixed hyperlipidemia: Secondary | ICD-10-CM | POA: Diagnosis not present

## 2020-01-05 DIAGNOSIS — I10 Essential (primary) hypertension: Secondary | ICD-10-CM

## 2020-01-05 NOTE — Patient Instructions (Signed)

## 2020-06-30 DIAGNOSIS — M216X1 Other acquired deformities of right foot: Secondary | ICD-10-CM | POA: Diagnosis not present

## 2020-06-30 DIAGNOSIS — M79671 Pain in right foot: Secondary | ICD-10-CM | POA: Diagnosis not present

## 2020-06-30 DIAGNOSIS — L84 Corns and callosities: Secondary | ICD-10-CM | POA: Diagnosis not present

## 2020-06-30 DIAGNOSIS — G8929 Other chronic pain: Secondary | ICD-10-CM | POA: Diagnosis not present

## 2020-07-05 ENCOUNTER — Other Ambulatory Visit: Payer: Self-pay | Admitting: Cardiology

## 2020-07-06 NOTE — Telephone Encounter (Signed)
Confirmed with patient she has Metoprolol 25mg  tablets ans 50mg  tablets. She reports taking half of each tablet, 12.5mg  + 25mg  BID. Patient takes total of 37.5mg  BID.

## 2020-07-06 NOTE — Telephone Encounter (Signed)
The only information I have is to look at the patient's chart and for some reason she has 2 prescriptions for the same drug.  She is a very intelligent woman is asked her what she is taken lets renew that and lets correct her medication list.

## 2020-07-14 DIAGNOSIS — N3091 Cystitis, unspecified with hematuria: Secondary | ICD-10-CM | POA: Diagnosis not present

## 2020-07-14 DIAGNOSIS — R300 Dysuria: Secondary | ICD-10-CM | POA: Diagnosis not present

## 2020-08-03 DIAGNOSIS — H3530 Unspecified macular degeneration: Secondary | ICD-10-CM | POA: Diagnosis not present

## 2020-08-03 DIAGNOSIS — H2703 Aphakia, bilateral: Secondary | ICD-10-CM | POA: Diagnosis not present

## 2020-08-11 DIAGNOSIS — H26493 Other secondary cataract, bilateral: Secondary | ICD-10-CM | POA: Diagnosis not present

## 2020-08-19 DIAGNOSIS — Z23 Encounter for immunization: Secondary | ICD-10-CM | POA: Diagnosis not present

## 2020-08-19 DIAGNOSIS — I25119 Atherosclerotic heart disease of native coronary artery with unspecified angina pectoris: Secondary | ICD-10-CM | POA: Diagnosis not present

## 2020-08-19 DIAGNOSIS — I10 Essential (primary) hypertension: Secondary | ICD-10-CM | POA: Diagnosis not present

## 2020-08-19 DIAGNOSIS — Z0000 Encounter for general adult medical examination without abnormal findings: Secondary | ICD-10-CM | POA: Diagnosis not present

## 2020-08-19 DIAGNOSIS — D485 Neoplasm of uncertain behavior of skin: Secondary | ICD-10-CM | POA: Diagnosis not present

## 2020-08-19 DIAGNOSIS — R7302 Impaired glucose tolerance (oral): Secondary | ICD-10-CM | POA: Diagnosis not present

## 2020-08-19 DIAGNOSIS — Z79899 Other long term (current) drug therapy: Secondary | ICD-10-CM | POA: Diagnosis not present

## 2020-08-19 DIAGNOSIS — E785 Hyperlipidemia, unspecified: Secondary | ICD-10-CM | POA: Diagnosis not present

## 2020-08-19 DIAGNOSIS — E040 Nontoxic diffuse goiter: Secondary | ICD-10-CM | POA: Diagnosis not present

## 2020-08-31 DIAGNOSIS — D485 Neoplasm of uncertain behavior of skin: Secondary | ICD-10-CM | POA: Diagnosis not present

## 2020-09-15 DIAGNOSIS — D519 Vitamin B12 deficiency anemia, unspecified: Secondary | ICD-10-CM | POA: Diagnosis not present

## 2020-09-15 DIAGNOSIS — I493 Ventricular premature depolarization: Secondary | ICD-10-CM | POA: Diagnosis not present

## 2020-09-15 DIAGNOSIS — R479 Unspecified speech disturbances: Secondary | ICD-10-CM | POA: Diagnosis not present

## 2020-09-15 DIAGNOSIS — E559 Vitamin D deficiency, unspecified: Secondary | ICD-10-CM | POA: Diagnosis not present

## 2020-09-15 DIAGNOSIS — R4189 Other symptoms and signs involving cognitive functions and awareness: Secondary | ICD-10-CM | POA: Diagnosis not present

## 2020-09-15 DIAGNOSIS — R221 Localized swelling, mass and lump, neck: Secondary | ICD-10-CM | POA: Diagnosis not present

## 2020-09-16 DIAGNOSIS — R4182 Altered mental status, unspecified: Secondary | ICD-10-CM | POA: Diagnosis not present

## 2020-09-16 DIAGNOSIS — R82998 Other abnormal findings in urine: Secondary | ICD-10-CM | POA: Diagnosis not present

## 2020-09-16 DIAGNOSIS — R479 Unspecified speech disturbances: Secondary | ICD-10-CM | POA: Diagnosis not present

## 2020-09-16 DIAGNOSIS — G319 Degenerative disease of nervous system, unspecified: Secondary | ICD-10-CM | POA: Diagnosis not present

## 2020-09-16 DIAGNOSIS — E041 Nontoxic single thyroid nodule: Secondary | ICD-10-CM | POA: Diagnosis not present

## 2020-09-16 DIAGNOSIS — Z853 Personal history of malignant neoplasm of breast: Secondary | ICD-10-CM | POA: Diagnosis not present

## 2020-09-16 DIAGNOSIS — R319 Hematuria, unspecified: Secondary | ICD-10-CM | POA: Diagnosis not present

## 2020-09-16 DIAGNOSIS — R4189 Other symptoms and signs involving cognitive functions and awareness: Secondary | ICD-10-CM | POA: Diagnosis not present

## 2020-09-16 DIAGNOSIS — R413 Other amnesia: Secondary | ICD-10-CM | POA: Diagnosis not present

## 2020-09-21 DIAGNOSIS — H6593 Unspecified nonsuppurative otitis media, bilateral: Secondary | ICD-10-CM | POA: Diagnosis not present

## 2020-09-21 DIAGNOSIS — Z6824 Body mass index (BMI) 24.0-24.9, adult: Secondary | ICD-10-CM | POA: Diagnosis not present

## 2020-09-21 DIAGNOSIS — J3489 Other specified disorders of nose and nasal sinuses: Secondary | ICD-10-CM | POA: Diagnosis not present

## 2020-09-21 DIAGNOSIS — R221 Localized swelling, mass and lump, neck: Secondary | ICD-10-CM | POA: Diagnosis not present

## 2020-09-22 DIAGNOSIS — R221 Localized swelling, mass and lump, neck: Secondary | ICD-10-CM | POA: Diagnosis not present

## 2020-09-24 DIAGNOSIS — E034 Atrophy of thyroid (acquired): Secondary | ICD-10-CM | POA: Diagnosis not present

## 2020-09-24 DIAGNOSIS — R221 Localized swelling, mass and lump, neck: Secondary | ICD-10-CM | POA: Diagnosis not present

## 2020-09-24 DIAGNOSIS — I6523 Occlusion and stenosis of bilateral carotid arteries: Secondary | ICD-10-CM | POA: Diagnosis not present

## 2020-09-24 DIAGNOSIS — K118 Other diseases of salivary glands: Secondary | ICD-10-CM | POA: Diagnosis not present

## 2020-09-24 DIAGNOSIS — I700 Atherosclerosis of aorta: Secondary | ICD-10-CM | POA: Diagnosis not present

## 2020-09-24 DIAGNOSIS — E049 Nontoxic goiter, unspecified: Secondary | ICD-10-CM | POA: Diagnosis not present

## 2020-09-24 DIAGNOSIS — M47812 Spondylosis without myelopathy or radiculopathy, cervical region: Secondary | ICD-10-CM | POA: Diagnosis not present

## 2020-09-27 DIAGNOSIS — E079 Disorder of thyroid, unspecified: Secondary | ICD-10-CM | POA: Diagnosis not present

## 2020-09-27 DIAGNOSIS — R221 Localized swelling, mass and lump, neck: Secondary | ICD-10-CM | POA: Diagnosis not present

## 2020-09-27 DIAGNOSIS — J329 Chronic sinusitis, unspecified: Secondary | ICD-10-CM | POA: Diagnosis not present

## 2020-09-27 DIAGNOSIS — Z6824 Body mass index (BMI) 24.0-24.9, adult: Secondary | ICD-10-CM | POA: Diagnosis not present

## 2020-10-11 DIAGNOSIS — E042 Nontoxic multinodular goiter: Secondary | ICD-10-CM | POA: Diagnosis not present

## 2020-10-11 DIAGNOSIS — R221 Localized swelling, mass and lump, neck: Secondary | ICD-10-CM | POA: Diagnosis not present

## 2020-10-11 HISTORY — DX: Localized swelling, mass and lump, neck: R22.1

## 2020-10-12 DIAGNOSIS — L720 Epidermal cyst: Secondary | ICD-10-CM | POA: Diagnosis not present

## 2020-10-12 HISTORY — DX: Epidermal cyst: L72.0

## 2020-10-19 ENCOUNTER — Other Ambulatory Visit: Payer: Self-pay | Admitting: Cardiology

## 2020-11-30 DIAGNOSIS — E079 Disorder of thyroid, unspecified: Secondary | ICD-10-CM | POA: Diagnosis not present

## 2020-11-30 DIAGNOSIS — R221 Localized swelling, mass and lump, neck: Secondary | ICD-10-CM | POA: Diagnosis not present

## 2020-11-30 HISTORY — DX: Disorder of thyroid, unspecified: E07.9

## 2020-12-09 ENCOUNTER — Other Ambulatory Visit: Payer: Self-pay | Admitting: Otolaryngology

## 2020-12-09 DIAGNOSIS — E079 Disorder of thyroid, unspecified: Secondary | ICD-10-CM

## 2020-12-10 ENCOUNTER — Other Ambulatory Visit: Payer: Self-pay | Admitting: Cardiology

## 2020-12-20 DIAGNOSIS — F331 Major depressive disorder, recurrent, moderate: Secondary | ICD-10-CM | POA: Diagnosis not present

## 2020-12-20 DIAGNOSIS — Z6823 Body mass index (BMI) 23.0-23.9, adult: Secondary | ICD-10-CM | POA: Diagnosis not present

## 2020-12-20 DIAGNOSIS — H60393 Other infective otitis externa, bilateral: Secondary | ICD-10-CM | POA: Diagnosis not present

## 2020-12-20 DIAGNOSIS — J329 Chronic sinusitis, unspecified: Secondary | ICD-10-CM | POA: Diagnosis not present

## 2020-12-20 DIAGNOSIS — H6593 Unspecified nonsuppurative otitis media, bilateral: Secondary | ICD-10-CM | POA: Diagnosis not present

## 2020-12-20 DIAGNOSIS — E049 Nontoxic goiter, unspecified: Secondary | ICD-10-CM | POA: Diagnosis not present

## 2020-12-20 DIAGNOSIS — I25119 Atherosclerotic heart disease of native coronary artery with unspecified angina pectoris: Secondary | ICD-10-CM | POA: Diagnosis not present

## 2020-12-20 DIAGNOSIS — F419 Anxiety disorder, unspecified: Secondary | ICD-10-CM | POA: Diagnosis not present

## 2020-12-20 DIAGNOSIS — E079 Disorder of thyroid, unspecified: Secondary | ICD-10-CM | POA: Diagnosis not present

## 2020-12-21 ENCOUNTER — Other Ambulatory Visit: Payer: Medicare PPO

## 2020-12-28 ENCOUNTER — Other Ambulatory Visit: Payer: Self-pay | Admitting: Cardiology

## 2020-12-29 ENCOUNTER — Ambulatory Visit
Admission: RE | Admit: 2020-12-29 | Discharge: 2020-12-29 | Disposition: A | Discharge status: Home / Self Care | Payer: Medicare PPO | Source: Ambulatory Visit | Attending: Otolaryngology | Admitting: Otolaryngology

## 2020-12-29 DIAGNOSIS — E042 Nontoxic multinodular goiter: Secondary | ICD-10-CM | POA: Diagnosis not present

## 2020-12-29 DIAGNOSIS — E079 Disorder of thyroid, unspecified: Secondary | ICD-10-CM

## 2021-01-28 ENCOUNTER — Other Ambulatory Visit: Payer: Self-pay | Admitting: Cardiology

## 2021-01-28 NOTE — Telephone Encounter (Signed)
Plavix approved and sent with instruction to arrange and appt. 1rst attempt

## 2021-02-18 DIAGNOSIS — I700 Atherosclerosis of aorta: Secondary | ICD-10-CM | POA: Diagnosis not present

## 2021-02-18 DIAGNOSIS — E559 Vitamin D deficiency, unspecified: Secondary | ICD-10-CM | POA: Diagnosis not present

## 2021-02-18 DIAGNOSIS — E538 Deficiency of other specified B group vitamins: Secondary | ICD-10-CM | POA: Diagnosis not present

## 2021-02-18 DIAGNOSIS — J329 Chronic sinusitis, unspecified: Secondary | ICD-10-CM | POA: Diagnosis not present

## 2021-02-18 DIAGNOSIS — Z6823 Body mass index (BMI) 23.0-23.9, adult: Secondary | ICD-10-CM | POA: Diagnosis not present

## 2021-02-28 DIAGNOSIS — Z23 Encounter for immunization: Secondary | ICD-10-CM | POA: Diagnosis not present

## 2021-03-02 ENCOUNTER — Other Ambulatory Visit: Payer: Self-pay | Admitting: Cardiology

## 2021-05-23 DIAGNOSIS — E785 Hyperlipidemia, unspecified: Secondary | ICD-10-CM | POA: Diagnosis not present

## 2021-05-23 DIAGNOSIS — I10 Essential (primary) hypertension: Secondary | ICD-10-CM | POA: Diagnosis not present

## 2021-05-23 DIAGNOSIS — R32 Unspecified urinary incontinence: Secondary | ICD-10-CM | POA: Diagnosis not present

## 2021-05-23 DIAGNOSIS — I25119 Atherosclerotic heart disease of native coronary artery with unspecified angina pectoris: Secondary | ICD-10-CM | POA: Diagnosis not present

## 2021-05-23 DIAGNOSIS — I252 Old myocardial infarction: Secondary | ICD-10-CM | POA: Diagnosis not present

## 2021-05-23 DIAGNOSIS — J309 Allergic rhinitis, unspecified: Secondary | ICD-10-CM | POA: Diagnosis not present

## 2021-05-23 DIAGNOSIS — K08409 Partial loss of teeth, unspecified cause, unspecified class: Secondary | ICD-10-CM | POA: Diagnosis not present

## 2021-05-23 DIAGNOSIS — Z7902 Long term (current) use of antithrombotics/antiplatelets: Secondary | ICD-10-CM | POA: Diagnosis not present

## 2021-05-23 DIAGNOSIS — Z8249 Family history of ischemic heart disease and other diseases of the circulatory system: Secondary | ICD-10-CM | POA: Diagnosis not present

## 2021-06-10 ENCOUNTER — Other Ambulatory Visit: Payer: Self-pay | Admitting: Cardiology

## 2021-07-21 DIAGNOSIS — J0111 Acute recurrent frontal sinusitis: Secondary | ICD-10-CM | POA: Diagnosis not present

## 2021-07-26 DIAGNOSIS — Z23 Encounter for immunization: Secondary | ICD-10-CM | POA: Diagnosis not present

## 2021-08-11 DIAGNOSIS — J40 Bronchitis, not specified as acute or chronic: Secondary | ICD-10-CM | POA: Diagnosis not present

## 2021-08-11 DIAGNOSIS — J329 Chronic sinusitis, unspecified: Secondary | ICD-10-CM | POA: Diagnosis not present

## 2021-08-11 DIAGNOSIS — Z6820 Body mass index (BMI) 20.0-20.9, adult: Secondary | ICD-10-CM | POA: Diagnosis not present

## 2021-08-11 DIAGNOSIS — Z20828 Contact with and (suspected) exposure to other viral communicable diseases: Secondary | ICD-10-CM | POA: Diagnosis not present

## 2021-08-14 DIAGNOSIS — R051 Acute cough: Secondary | ICD-10-CM | POA: Diagnosis not present

## 2021-08-14 DIAGNOSIS — J029 Acute pharyngitis, unspecified: Secondary | ICD-10-CM | POA: Diagnosis not present

## 2021-08-17 DIAGNOSIS — J40 Bronchitis, not specified as acute or chronic: Secondary | ICD-10-CM | POA: Diagnosis not present

## 2021-08-17 DIAGNOSIS — J329 Chronic sinusitis, unspecified: Secondary | ICD-10-CM | POA: Diagnosis not present

## 2021-08-17 DIAGNOSIS — Z6821 Body mass index (BMI) 21.0-21.9, adult: Secondary | ICD-10-CM | POA: Diagnosis not present

## 2021-08-27 IMAGING — US US THYROID
1 series · 13 of 25 positions shown · non-contrast
Comparison: 09/16/2020, 11/05/2006

CLINICAL DATA: Thyroid mass

EXAM:
THYROID ULTRASOUND
TECHNIQUE: Ultrasound examination of the thyroid gland and adjacent soft
tissues was performed.

[Series 1: us thyroid · 0.07mm/px · 13 of 47 slices shown]
[im 1/47]
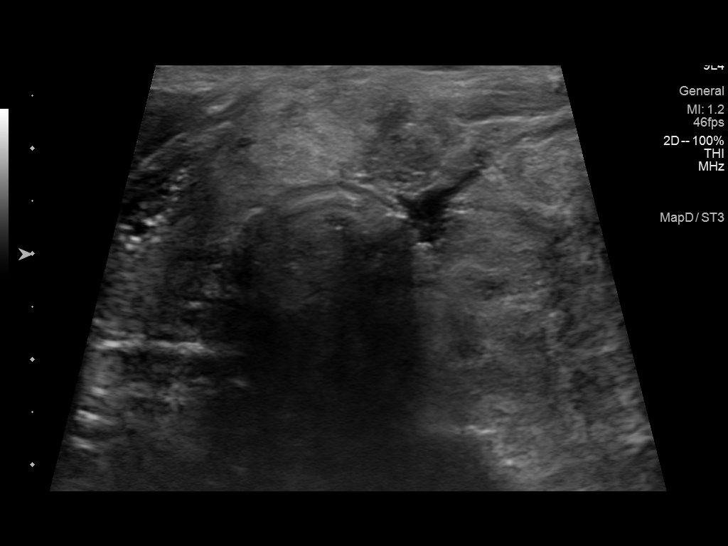
[im 4/47]
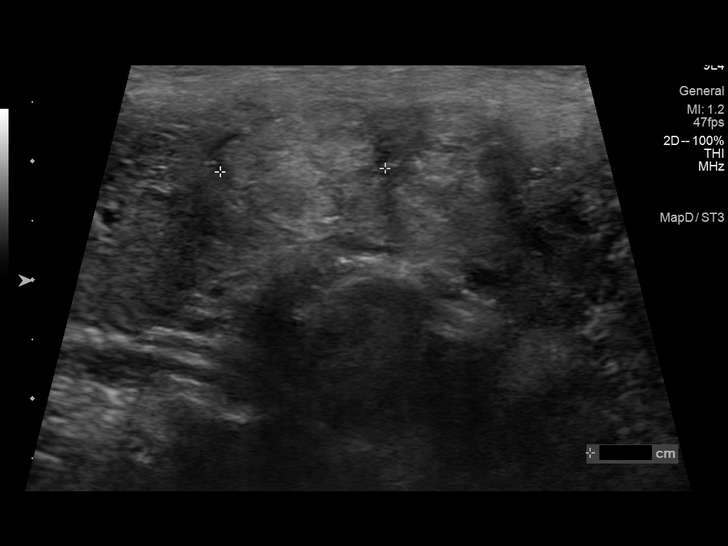
[im 8/47]
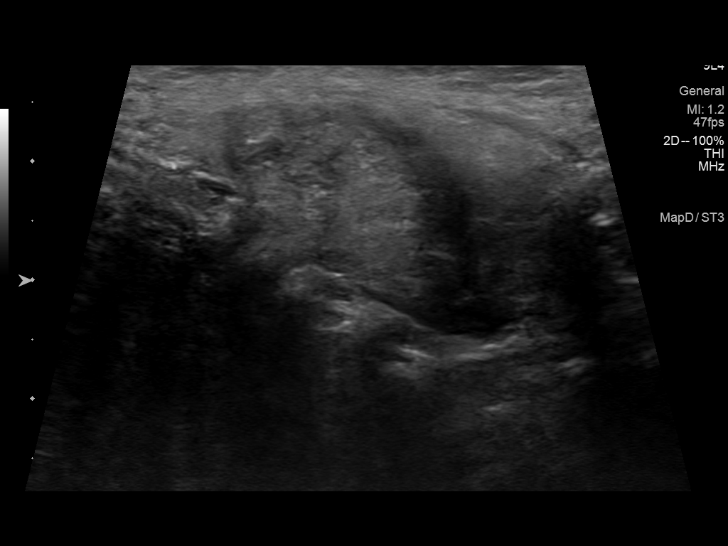
[im 12/47]
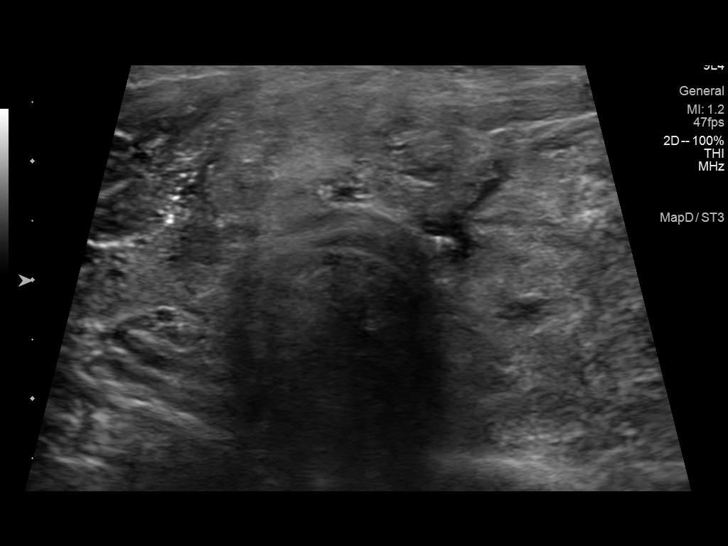
[im 16/47]
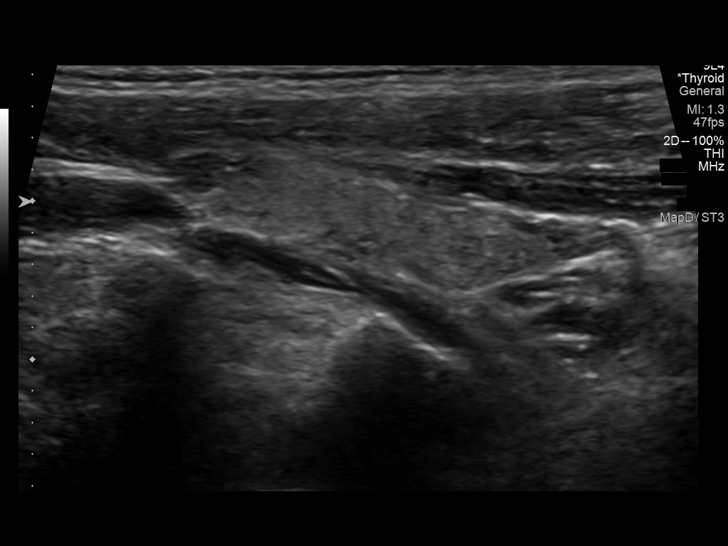
[im 20/47]
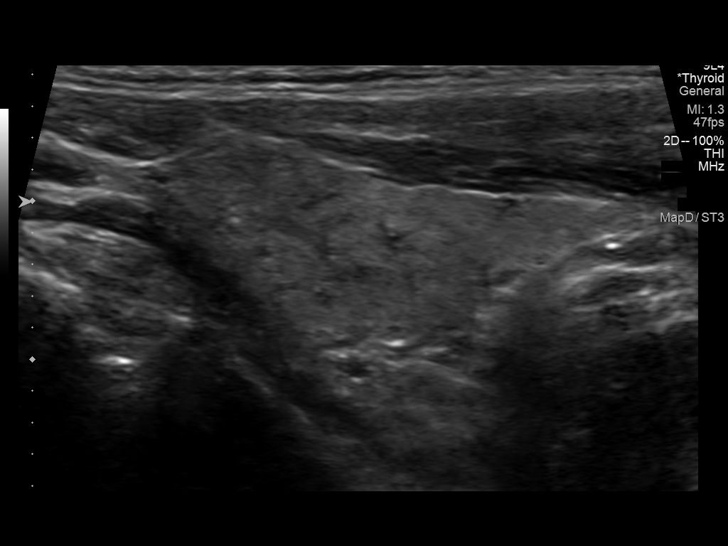
[im 24/47]
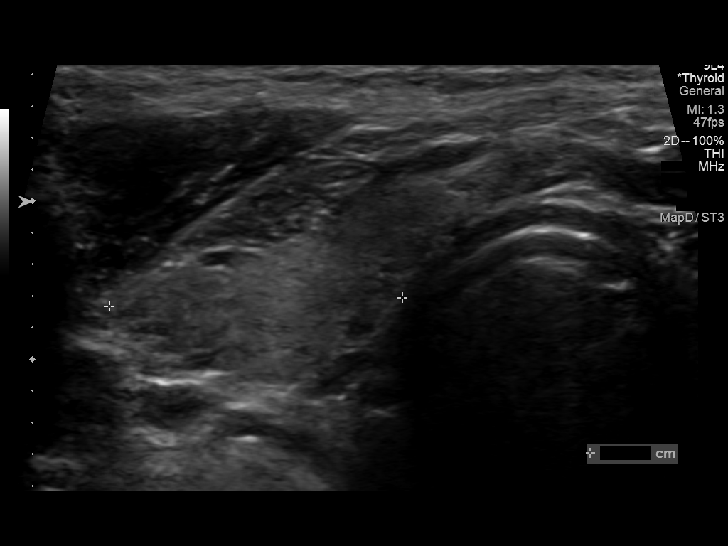
[im 27/47]
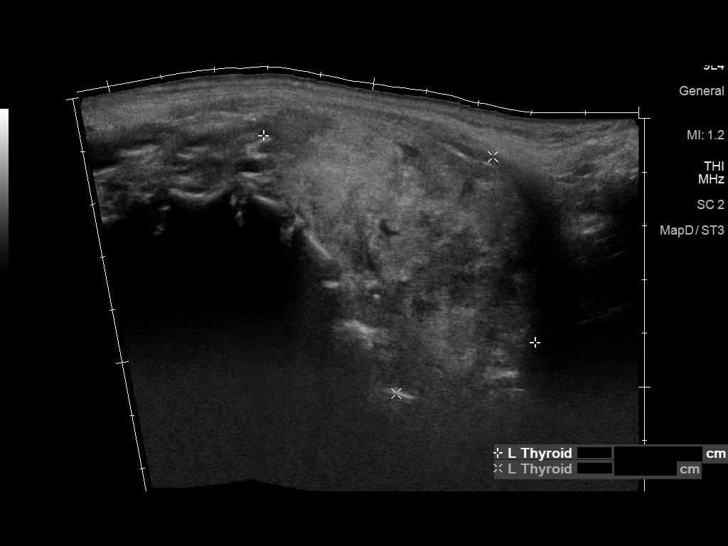
[im 31/47]
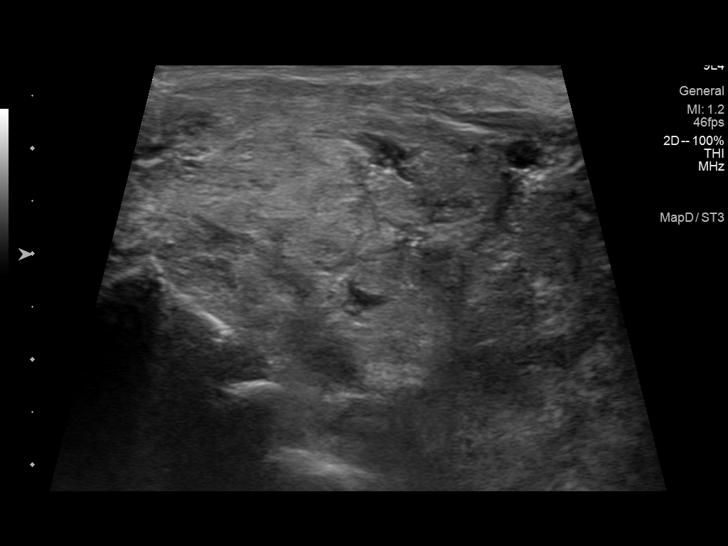
[im 35/47]
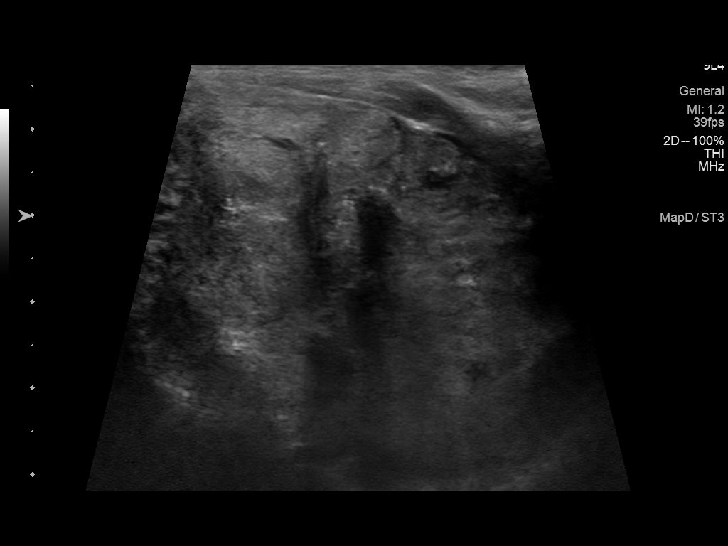
[im 39/47]
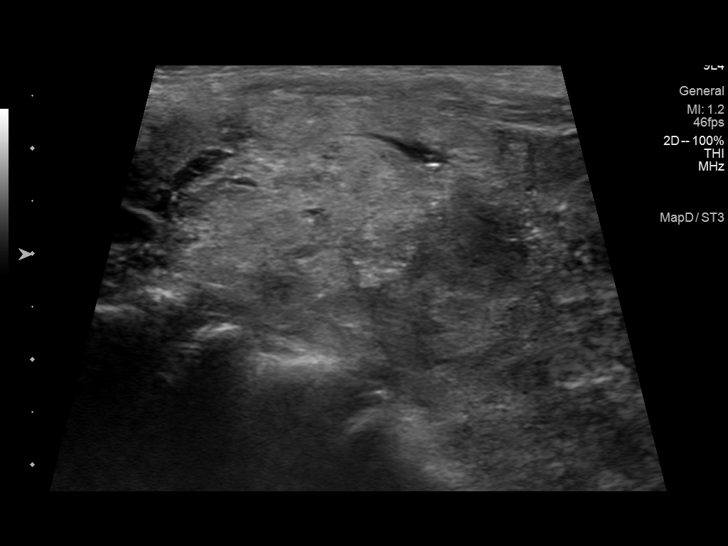
[im 43/47]
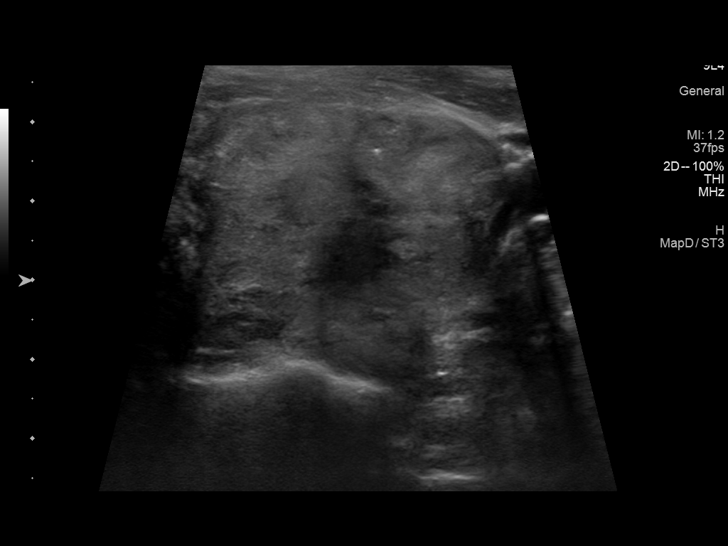
[im 47/47]
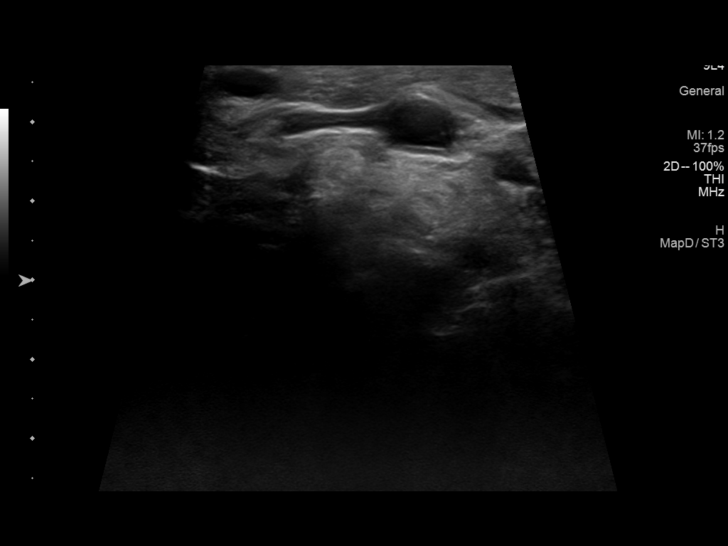

[13 of 25 positions shown; findings below may reference images not displayed]

FINDINGS: Parenchymal Echotexture: Moderately heterogenous

Isthmus: 1.1 cm thickness, previously

Right lobe: 3.3 x 0.9 x 1.9 cm, previously 3.8 x 0.9 x

Left lobe: 6.3 x 4.7 x 3.8 cm, previously 6.3 x 3.6 x

_________________________________________________________

Estimated total number of nodules >/= 1 cm: 3

Number of spongiform nodules >/=  2 cm not described below (TR1): 0

Number of mixed cystic and solid nodules >/= 1.5 cm not described
below (TR2): 0

_________________________________________________________

Nodule # 1:

Location: Isthmus; superior

Maximum size: 1.4 cm; Other 2 dimensions: 1.1 x 1.2 cm

Composition: solid/almost completely solid (2)

Echogenicity: isoechoic (1)

Shape: not taller-than-wide (0)

Margins: ill-defined (0)

Echogenic foci: none (0)

ACR TI-RADS total points: 3.

ACR TI-RADS risk category: TR3 (3 points).

ACR TI-RADS recommendations:

Given size (<1.4 cm) and appearance, this nodule does NOT meet
TI-RADS criteria for biopsy or dedicated follow-up.

_________________________________________________________

Nodule # 2:

Location: Isthmus; left inferolateral

Maximum size: 2.5 cm; Other 2 dimensions: 1.5 x 1.8 cm previously
2.2 x 1.2 on prior study 11/05/2006

Composition: solid/almost completely solid (2)

Echogenicity: isoechoic (1)

Shape: not taller-than-wide (0)

Margins: ill-defined (0)

Echogenic foci: none (0)

ACR TI-RADS total points: 3.

ACR TI-RADS risk category: TR3 (3 points).

ACR TI-RADS recommendations:

Stability for greater than 5 years implies benignity. No biopsy or
follow-up recommended.

_________________________________________________________

Nodule # 3:

Location: Right; Superior

Maximum size: 1 cm; Other 2 dimensions: 0.8 x 0.9 cm

Composition: solid/almost completely solid (2)

Echogenicity: hypoechoic (2)

Shape: not taller-than-wide (0)

Margins: ill-defined (0)

Echogenic foci: none (0)

ACR TI-RADS total points: 4.

ACR TI-RADS risk category: TR4 (4-6 points).

ACR TI-RADS recommendations:

*Given size (>/= 1 - 1.4 cm) and appearance, a follow-up ultrasound
in 1 year should be considered based on TI-RADS criteria.

_________________________________________________________

Left lobe is enlarged and markedly heterogenous without discrete
demonstrable nodule.
IMPRESSION: 1. Heterogenous asymmetric thyroid with nodules as above. None meet
criteria for biopsy.
2. Recommend annual/biennial ultrasound follow-up of right nodule as
above, until stability x5 years confirmed.

The above is in keeping with the ACR TI-RADS recommendations - [HOSPITAL] 9895;[DATE].

## 2021-08-31 DIAGNOSIS — B079 Viral wart, unspecified: Secondary | ICD-10-CM | POA: Diagnosis not present

## 2021-08-31 DIAGNOSIS — L538 Other specified erythematous conditions: Secondary | ICD-10-CM | POA: Diagnosis not present

## 2021-08-31 DIAGNOSIS — D485 Neoplasm of uncertain behavior of skin: Secondary | ICD-10-CM | POA: Diagnosis not present

## 2021-09-02 DIAGNOSIS — Z23 Encounter for immunization: Secondary | ICD-10-CM | POA: Diagnosis not present

## 2021-09-13 DIAGNOSIS — R419 Unspecified symptoms and signs involving cognitive functions and awareness: Secondary | ICD-10-CM | POA: Diagnosis not present

## 2021-09-13 DIAGNOSIS — R079 Chest pain, unspecified: Secondary | ICD-10-CM | POA: Diagnosis not present

## 2021-09-13 DIAGNOSIS — R059 Cough, unspecified: Secondary | ICD-10-CM | POA: Diagnosis not present

## 2021-09-13 DIAGNOSIS — Z20822 Contact with and (suspected) exposure to covid-19: Secondary | ICD-10-CM | POA: Diagnosis not present

## 2021-09-13 DIAGNOSIS — R072 Precordial pain: Secondary | ICD-10-CM | POA: Diagnosis not present

## 2021-09-13 DIAGNOSIS — I10 Essential (primary) hypertension: Secondary | ICD-10-CM | POA: Diagnosis not present

## 2021-09-13 DIAGNOSIS — F419 Anxiety disorder, unspecified: Secondary | ICD-10-CM | POA: Diagnosis not present

## 2021-09-13 DIAGNOSIS — I700 Atherosclerosis of aorta: Secondary | ICD-10-CM | POA: Diagnosis not present

## 2021-09-13 DIAGNOSIS — I517 Cardiomegaly: Secondary | ICD-10-CM | POA: Diagnosis not present

## 2021-09-13 DIAGNOSIS — Z79899 Other long term (current) drug therapy: Secondary | ICD-10-CM | POA: Diagnosis not present

## 2021-09-13 DIAGNOSIS — E7800 Pure hypercholesterolemia, unspecified: Secondary | ICD-10-CM | POA: Diagnosis not present

## 2021-09-13 DIAGNOSIS — K529 Noninfective gastroenteritis and colitis, unspecified: Secondary | ICD-10-CM | POA: Diagnosis not present

## 2021-09-13 DIAGNOSIS — K862 Cyst of pancreas: Secondary | ICD-10-CM | POA: Diagnosis not present

## 2021-09-13 DIAGNOSIS — N289 Disorder of kidney and ureter, unspecified: Secondary | ICD-10-CM | POA: Diagnosis not present

## 2021-09-13 DIAGNOSIS — I2510 Atherosclerotic heart disease of native coronary artery without angina pectoris: Secondary | ICD-10-CM | POA: Diagnosis not present

## 2021-09-13 DIAGNOSIS — K219 Gastro-esophageal reflux disease without esophagitis: Secondary | ICD-10-CM | POA: Diagnosis not present

## 2021-09-14 DIAGNOSIS — E785 Hyperlipidemia, unspecified: Secondary | ICD-10-CM | POA: Diagnosis not present

## 2021-09-14 DIAGNOSIS — I2510 Atherosclerotic heart disease of native coronary artery without angina pectoris: Secondary | ICD-10-CM | POA: Diagnosis not present

## 2021-09-14 DIAGNOSIS — I10 Essential (primary) hypertension: Secondary | ICD-10-CM | POA: Diagnosis not present

## 2021-09-15 DIAGNOSIS — R419 Unspecified symptoms and signs involving cognitive functions and awareness: Secondary | ICD-10-CM | POA: Diagnosis not present

## 2021-09-15 DIAGNOSIS — E785 Hyperlipidemia, unspecified: Secondary | ICD-10-CM | POA: Diagnosis not present

## 2021-09-15 DIAGNOSIS — I10 Essential (primary) hypertension: Secondary | ICD-10-CM | POA: Diagnosis not present

## 2021-09-15 DIAGNOSIS — R079 Chest pain, unspecified: Secondary | ICD-10-CM | POA: Diagnosis not present

## 2021-09-15 DIAGNOSIS — I2510 Atherosclerotic heart disease of native coronary artery without angina pectoris: Secondary | ICD-10-CM | POA: Diagnosis not present

## 2021-12-29 DIAGNOSIS — R32 Unspecified urinary incontinence: Secondary | ICD-10-CM | POA: Diagnosis not present

## 2021-12-29 DIAGNOSIS — I252 Old myocardial infarction: Secondary | ICD-10-CM | POA: Diagnosis not present

## 2021-12-29 DIAGNOSIS — M810 Age-related osteoporosis without current pathological fracture: Secondary | ICD-10-CM | POA: Diagnosis not present

## 2021-12-29 DIAGNOSIS — Z7902 Long term (current) use of antithrombotics/antiplatelets: Secondary | ICD-10-CM | POA: Diagnosis not present

## 2021-12-29 DIAGNOSIS — G8929 Other chronic pain: Secondary | ICD-10-CM | POA: Diagnosis not present

## 2021-12-29 DIAGNOSIS — F419 Anxiety disorder, unspecified: Secondary | ICD-10-CM | POA: Diagnosis not present

## 2021-12-29 DIAGNOSIS — I10 Essential (primary) hypertension: Secondary | ICD-10-CM | POA: Diagnosis not present

## 2021-12-29 DIAGNOSIS — I25119 Atherosclerotic heart disease of native coronary artery with unspecified angina pectoris: Secondary | ICD-10-CM | POA: Diagnosis not present

## 2021-12-29 DIAGNOSIS — E785 Hyperlipidemia, unspecified: Secondary | ICD-10-CM | POA: Diagnosis not present

## 2022-03-15 DIAGNOSIS — F419 Anxiety disorder, unspecified: Secondary | ICD-10-CM | POA: Insufficient documentation

## 2022-03-15 DIAGNOSIS — R0609 Other forms of dyspnea: Secondary | ICD-10-CM | POA: Insufficient documentation

## 2022-03-15 DIAGNOSIS — R0601 Orthopnea: Secondary | ICD-10-CM

## 2022-03-15 DIAGNOSIS — E538 Deficiency of other specified B group vitamins: Secondary | ICD-10-CM | POA: Insufficient documentation

## 2022-03-15 DIAGNOSIS — Z951 Presence of aortocoronary bypass graft: Secondary | ICD-10-CM

## 2022-03-15 DIAGNOSIS — K862 Cyst of pancreas: Secondary | ICD-10-CM

## 2022-03-15 DIAGNOSIS — I2089 Other forms of angina pectoris: Secondary | ICD-10-CM

## 2022-03-15 DIAGNOSIS — K219 Gastro-esophageal reflux disease without esophagitis: Secondary | ICD-10-CM | POA: Insufficient documentation

## 2022-03-15 DIAGNOSIS — I208 Other forms of angina pectoris: Secondary | ICD-10-CM | POA: Insufficient documentation

## 2022-03-15 DIAGNOSIS — E785 Hyperlipidemia, unspecified: Secondary | ICD-10-CM

## 2022-03-15 DIAGNOSIS — Z5329 Procedure and treatment not carried out because of patient's decision for other reasons: Secondary | ICD-10-CM

## 2022-03-15 DIAGNOSIS — S62339A Displaced fracture of neck of unspecified metacarpal bone, initial encounter for closed fracture: Secondary | ICD-10-CM

## 2022-03-15 DIAGNOSIS — F32A Depression, unspecified: Secondary | ICD-10-CM | POA: Insufficient documentation

## 2022-03-15 DIAGNOSIS — J209 Acute bronchitis, unspecified: Secondary | ICD-10-CM

## 2022-03-15 DIAGNOSIS — I10 Essential (primary) hypertension: Secondary | ICD-10-CM

## 2022-03-15 DIAGNOSIS — I25810 Atherosclerosis of coronary artery bypass graft(s) without angina pectoris: Secondary | ICD-10-CM | POA: Insufficient documentation

## 2022-03-15 HISTORY — DX: Other forms of angina pectoris: I20.89

## 2022-03-15 HISTORY — DX: Procedure and treatment not carried out because of patient's decision for other reasons: Z53.29

## 2022-03-15 HISTORY — DX: Presence of aortocoronary bypass graft: Z95.1

## 2022-03-15 HISTORY — DX: Hyperlipidemia, unspecified: E78.5

## 2022-03-15 HISTORY — DX: Cyst of pancreas: K86.2

## 2022-03-15 HISTORY — DX: Essential (primary) hypertension: I10

## 2022-03-15 HISTORY — DX: Atherosclerosis of coronary artery bypass graft(s) without angina pectoris: I25.810

## 2022-03-15 HISTORY — DX: Orthopnea: R06.01

## 2022-03-15 HISTORY — DX: Other forms of angina pectoris: I20.8

## 2022-03-15 HISTORY — DX: Acute bronchitis, unspecified: J20.9

## 2022-03-15 HISTORY — DX: Other forms of dyspnea: R06.09

## 2022-03-15 HISTORY — DX: Gastro-esophageal reflux disease without esophagitis: K21.9

## 2022-03-15 HISTORY — DX: Depression, unspecified: F32.A

## 2022-03-15 HISTORY — DX: Displaced fracture of neck of unspecified metacarpal bone, initial encounter for closed fracture: S62.339A

## 2022-03-15 HISTORY — DX: Anxiety disorder, unspecified: F41.9

## 2022-03-16 NOTE — Progress Notes (Unsigned)
Cardiology Office Note:    Date:  03/17/2022   ID:  Christine Rich, DOB 26-Nov-1933, MRN [id number]  PCP:  Street, Sharon Mt, MD  Cardiologist:  Shirlee More, MD    Referring MD: [address] Sharon Mt, *    ASSESSMENT:    1. Coronary artery disease of bypass graft of native heart with stable angina pectoris ([place])   2. Essential hypertension   3. Mixed hyperlipidemia    PLAN:    In order of problems listed above:  She is doing well with CAD having rare angina relieved with nitroglycerin continue her current treatment including dual antiplatelet oral nitrate beta-blocker and lipid-lowering. Stable BP at target continue current treatment trend blood pressures at home repeat blood pressure is 142/80 Continue high intensity statin we will check a lipid profile and CMP today   Next appointment: 6 months   Medication Adjustments/Labs and Tests Ordered: Current medicines are reviewed at length with the patient today.  Concerns regarding medicines are outlined above.  Orders Placed This Encounter  Procedures   Comp Met (CMET)   Lipid Profile   EKG 12-Lead   Meds ordered this encounter  Medications   clopidogrel (PLAVIX) 75 MG tablet    Sig: Take 1 tablet (75 mg total) by mouth daily. Patient needs an appointment for further refills. 2 nd attempt    Dispense:  90 tablet    Refill:  3   metoprolol tartrate (LOPRESSOR) 25 MG tablet    Sig: Take 1 tablet (25 mg total) by mouth 2 (two) times daily.    Dispense:  180 tablet    Refill:  3   metoprolol tartrate (LOPRESSOR) 50 MG tablet    Sig: Take 1 tablet (50 mg total) by mouth 2 (two) times daily.    Dispense:  180 tablet    Refill:  3    Chief Complaint  Patient presents with   Follow-up   Coronary Artery Disease    History of Present Illness:    Christine Rich is a 88 y.o. female with a hx of CAD s/p CABG 2013 and NSTEMI 10/17/16 with PCi and DES to SVG to PDA EF 55-60%, DLD, HTN  last seen 01/04/2020.  Compliance  with diet, lifestyle and medications: yes  She has had no further hospitalizations since her last visit She continues to live independently has had rare anginal discomfort and tells me every 3 to 4 months she uses a nitroglycerin No edema shortness of breath orthopnea chest pain palpitation or syncope She tells me she is going to get hearing aids. She tolerates dual antiplatelet therapy aspirin and Plavix without bleeding and lipid-lowering with her high intensity statin without muscle pain or weakness Past Medical History:  Diagnosis Date   Abnormal CXR 06/13/2019   Accelerated hypertension 03/15/2022   Acute bronchitis 03/15/2022   Angina at rest [facility]) 03/15/2022   Anxiety 03/15/2022   Anxiety and depression 03/15/2022   Last Assessment & Plan:  Formatting of this note might be different from the original. - not on meds at home, clinically very anxious, noted received buspirone 40m x1 at midnight  - support provided   Boxers fracture 03/15/2022   CAD (coronary artery disease) of artery bypass graft 03/15/2022   Chest pain 06/06/2019   Last Assessment & Plan:  Formatting of this note might be different from the original. - intermittent chest pain over the past 1-2 weeks, currently pain free   - will check 2nd trop, CK today - EKG  non-specific, start telemetry monitor  - cardiology consult pending  - Echo pending   Coronary artery disease involving native coronary artery of native heart 11/17/2015   Overview:  s/p CABGSx5 in 2003Lexiscan MPS in Aug 2011 without ischemia, EF 65% Cardiolite 10/29/12: CONCLUSIONS:  Study quality: fair, with obvious inferior attenuation artifact reducing the sensitivity of the test  (1) diaphragm attenuation artifact - prior non-transmural infarction cannot be excluded  (2) normal LV size, with ? inferior hypokinesis - measured LVEF of 48% may be an underestimate  (3) no areas of (vasodilator) stress-induced hypoperfusion are identified.  Negative perfusion stress test for  potenital ischemia.   Cardiac catheterization January 2018 drug-eluting stent to graft going to the posterior descending artery   Cough 06/13/2019   Cyst of skin and subcutaneous tissue 10/12/2020   Dyspnea on exertion 03/15/2022   Essential hypertension 11/17/2015   GERD (gastroesophageal reflux disease) 03/15/2022   Hx of CABG 03/15/2022   Hyperlipemia 11/17/2015   Hyperlipidemia 03/15/2022   Left against medical advice 03/15/2022   Malignant neoplasm of left female breast ([place]) 01/23/2018   Neck mass 10/11/2020   Nontoxic multinodular goiter 08/14/2016   NSTEMI (non-ST elevated myocardial infarction) ([place]) 10/18/2016   Orthopnea 03/15/2022   Pancreatic cyst 03/15/2022   Paresthesia 11/27/2019   Posterior neck pain 01/23/2018   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 - cough, fatigue, weakness for 1-2 weeks - nonspecific symptoms, will r/o COVID-19   Thyroid mass 11/30/2020   Vitamin B 12 deficiency    Weakness 06/06/2019   Last Assessment & Plan:  Formatting of this note might be different from the original. - TSH WNL - B12 low, replacement as below  - r/o ACS, COVID-19, likely due to age and debility , PT to see    Past Surgical History:  Procedure Laterality Date   ABDOMINAL HYSTERECTOMY     CORONARY ARTERY BYPASS GRAFT     MASTECTOMY      Current Medications: Current Meds  Medication Sig   aspirin EC 81 MG tablet Take 81 mg by mouth daily.   citalopram (CELEXA) 20 MG tablet Take 20 mg by mouth daily.    isosorbide mononitrate (IMDUR) 30 MG 24 hr tablet Take 1 tablet (30 mg total) by mouth daily.   losartan (COZAAR) 25 MG tablet Take 25 mg by mouth daily.    nitroGLYCERIN (NITROSTAT) 0.4 MG SL tablet  PLACE 1 TABLET UNDER THE TONGUE EVERY 5 MINUTES FOR UP TO 3 DOSES AS NEEDED FOR CHEST PAIN. CALL 911 IF PAIN PERSISTS.   rosuvastatin (CRESTOR) 40 MG tablet Take 1 tablet (40 mg total) by mouth daily.   [DISCONTINUED] clopidogrel (PLAVIX) 75 MG tablet Take 1 tablet (75 mg total) by mouth daily. Patient needs an appointment for further refills. 2 nd attempt   [DISCONTINUED] metoprolol tartrate (LOPRESSOR) 25 MG tablet Take 1 tablet (25 mg total) by mouth 2 (two) times daily.   [DISCONTINUED] metoprolol tartrate (LOPRESSOR) 50 MG tablet Take 75 mg by mouth 2 (two) times daily.     Allergies:   Patient has no known allergies.   Social History   Socioeconomic History   Marital status: Married    Spouse name: Not on file   Number of children:  Not on file   Years of education: Not on file   Highest education level: Not on file  Occupational History   Not on file  Tobacco Use   Smoking status: Never   Smokeless tobacco: Never  Vaping Use   Vaping Use: Never used  Substance and Sexual Activity   Alcohol use: No   Drug use: No   Sexual activity: Not on file  Other Topics Concern   Not on file  Social History Narrative   Not on file   Social Determinants of Health   Financial Resource Strain: Not on file  Food Insecurity: Not on file  Transportation Needs: Not on file  Physical Activity: Not on file  Stress: Not on file  Social Connections: Not on file     Family History: The patient's family history includes Heart disease in her father and mother. ROS:   Please see the history of present illness.    All other systems reviewed and are negative.  EKGs/Labs/Other Studies Reviewed:    The following studies were reviewed today:  EKG:  EKG ordered today and personally reviewed.  The ekg ordered today demonstrates sinus rhythm right bundle branch block.  Recent Labs: No results found for requested labs within last 8760 hours.  Recent Lipid Panel    Component Value Date/Time    CHOL 143 09/29/2019 1014   TRIG 148 09/29/2019 1014   HDL 43 09/29/2019 1014   CHOLHDL 3.3 09/29/2019 1014   LDLCALC 74 09/29/2019 1014    Physical Exam:    VS:  BP (!) 152/92   Pulse 70   Ht 5' 1"  (1.549 m)   Wt 113 lb 12.8 oz (51.6 kg)   SpO2 99%   BMI 21.50 kg/m     Wt Readings from Last 3 Encounters:  03/17/22 113 lb 12.8 oz (51.6 kg)  01/05/20 143 lb (64.9 kg)  09/29/19 140 lb (63.5 kg)     GEN: Appears her age well nourished, well developed in no acute distress HEENT: Normal NECK: No JVD; No carotid bruits LYMPHATICS: No lymphadenopathy CARDIAC: RRR, no murmurs, rubs, gallops RESPIRATORY:  Clear to auscultation without rales, wheezing or rhonchi  ABDOMEN: Soft, non-tender, non-distended MUSCULOSKELETAL:  No edema; No deformity  SKIN: Warm and dry NEUROLOGIC:  Alert and oriented x 3 PSYCHIATRIC:  Normal affect    Signed, Shirlee More, MD  03/17/2022 11:55 AM    [place]

## 2022-03-17 ENCOUNTER — Ambulatory Visit: Payer: Medicare PPO | Admitting: Cardiology

## 2022-03-17 ENCOUNTER — Encounter: Payer: Self-pay | Admitting: Cardiology

## 2022-03-17 VITALS — BP 152/92 | HR 70 | Ht 61.0 in | Wt 113.8 lb

## 2022-03-17 DIAGNOSIS — I10 Essential (primary) hypertension: Secondary | ICD-10-CM

## 2022-03-17 DIAGNOSIS — I25708 Atherosclerosis of coronary artery bypass graft(s), unspecified, with other forms of angina pectoris: Secondary | ICD-10-CM

## 2022-03-17 DIAGNOSIS — E782 Mixed hyperlipidemia: Secondary | ICD-10-CM

## 2022-03-17 MED ORDER — METOPROLOL TARTRATE 25 MG PO TABS: 25.0000 mg | ORAL_TABLET | Freq: Two times a day (BID) | ORAL | 3 refills | Status: DC

## 2022-03-17 MED ORDER — METOPROLOL TARTRATE 50 MG PO TABS: 50.0000 mg | ORAL_TABLET | Freq: Two times a day (BID) | ORAL | 3 refills | Status: DC

## 2022-03-17 MED ORDER — CLOPIDOGREL BISULFATE 75 MG PO TABS: 75.0000 mg | ORAL_TABLET | Freq: Every day | ORAL | 3 refills | Status: DC

## 2022-03-17 NOTE — Patient Instructions (Signed)
Medication Instructions:  Your physician recommends that you continue on your current medications as directed. Please refer to the Current Medication list given to you today.  *If you need a refill on your cardiac medications before your next appointment, please call your pharmacy*   Lab Work: Your physician recommends that you return for lab work in:   Labs today: CMP, Lipids  If you have labs (blood work) drawn today and your tests are completely normal, you will receive your results only by: [place] (if you have [place]) OR A paper copy in the mail If you have any lab test that is abnormal or we need to change your treatment, we will call you to review the results.   Testing/Procedures: None   Follow-Up: At [facility], you and your health needs are our priority.  As part of our continuing mission to provide you with exceptional heart care, we have created designated Provider Care Teams.  These Care Teams include your primary Cardiologist (physician) and Advanced Practice Providers (APPs -  Physician Assistants and Nurse Practitioners) who all work together to provide you with the care you need, when you need it.  We recommend signing up for the patient portal called "MyChart".  Sign up information is provided on this After Visit Summary.  MyChart is used to connect with patients for Virtual Visits (Telemedicine).  Patients are able to view lab/test results, encounter notes, upcoming appointments, etc.  Non-urgent messages can be sent to your provider as well.   To learn more about what you can do with MyChart, go to [email or web address].    Your next appointment:   6 month(s)  The format for your next appointment:   In Person  Provider:   Shirlee More, MD    Other Instructions You can use topical Voltaren for right shoulder and Tylenol by mouth.  Use [facility] for hearing aids  Important Information About [place]

## 2022-03-18 LAB — COMPREHENSIVE METABOLIC PANEL
ALT: 16 IU/L (ref 0–32)
AST: 19 IU/L (ref 0–40)
Albumin/Globulin Ratio: 2 (ref 1.2–2.2)
Albumin: 4.2 g/dL (ref 3.6–4.6)
Alkaline Phosphatase: 65 IU/L (ref 44–121)
BUN/Creatinine Ratio: 15 (ref 12–28)
BUN: 14 mg/dL (ref 8–27)
Bilirubin Total: 0.6 mg/dL (ref 0.0–1.2)
CO2: 27 mmol/L (ref 20–29)
Calcium: 9.8 mg/dL (ref 8.7–10.3)
Chloride: 105 mmol/L (ref 96–106)
Creatinine, Ser: 0.92 mg/dL (ref 0.57–1.00)
Globulin, Total: 2.1 g/dL (ref 1.5–4.5)
Glucose: 76 mg/dL (ref 70–99)
Potassium: 4.2 mmol/L (ref 3.5–5.2)
Sodium: 143 mmol/L (ref 134–144)
Total Protein: 6.3 g/dL (ref 6.0–8.5)
eGFR: 61 mL/min/{1.73_m2} (ref >59)

## 2022-03-18 LAB — LIPID PANEL
Chol/HDL Ratio: 4.3 ratio (ref 0.0–4.4)
Cholesterol, Total: 214 mg/dL — ABNORMAL HIGH (ref 100–199)
HDL: 50 mg/dL (ref >39)
LDL Chol Calc (NIH): 149 mg/dL — ABNORMAL HIGH (ref 0–99)
Triglycerides: 83 mg/dL (ref 0–149)
VLDL Cholesterol Cal: 15 mg/dL (ref 5–40)

## 2022-03-20 ENCOUNTER — Telehealth: Payer: Self-pay

## 2022-03-20 ENCOUNTER — Other Ambulatory Visit: Payer: Self-pay

## 2022-03-20 MED ORDER — ROSUVASTATIN CALCIUM 40 MG PO TABS: 40.0000 mg | ORAL_TABLET | Freq: Every day | ORAL | 3 refills | Status: DC

## 2022-03-20 MED ORDER — CLOPIDOGREL BISULFATE 75 MG PO TABS: 75.0000 mg | ORAL_TABLET | Freq: Every day | ORAL | 3 refills | Status: AC

## 2022-03-20 MED ORDER — NITROGLYCERIN 0.4 MG SL SUBL: SUBLINGUAL_TABLET | SUBLINGUAL | 3 refills | Status: DC

## 2022-03-20 NOTE — Telephone Encounter (Signed)
Called patient to inform her of the results of her blood work and asked her if she was taking her statin medication because Dr. Bettina Gavia was concerned that she was not taking it. Patient stated that her original pharmacy had closed and that her records had been transferred to [facility] and she had run out of her Crestor. I informed the patient that I was going to send in a refill of her Crestor to her new pharmacy at [facility]. Patient stated that it would be o.k. and she  had no further questions at this time.

## 2022-03-20 NOTE — Telephone Encounter (Signed)
-----   Message from Truddie Hidden, RN sent at 03/20/2022  7:40 AM EDT -----  ----- Message ----- From: Richardo Priest, MD Sent: 03/19/2022   2:12 PM EDT To: Rebeca Alert Ash/Hp Triage  Looks like she is not taking her statin  That is the case and like her to resume if she is hesitant she can take it 2 days a week rosuvastatin.

## 2022-04-22 DIAGNOSIS — S93401A Sprain of unspecified ligament of right ankle, initial encounter: Secondary | ICD-10-CM | POA: Diagnosis not present

## 2022-04-22 DIAGNOSIS — Z8673 Personal history of transient ischemic attack (TIA), and cerebral infarction without residual deficits: Secondary | ICD-10-CM | POA: Diagnosis not present

## 2022-04-22 DIAGNOSIS — Z7902 Long term (current) use of antithrombotics/antiplatelets: Secondary | ICD-10-CM | POA: Diagnosis not present

## 2022-04-22 DIAGNOSIS — W010XXA Fall on same level from slipping, tripping and stumbling without subsequent striking against object, initial encounter: Secondary | ICD-10-CM | POA: Diagnosis not present

## 2022-04-22 DIAGNOSIS — M25571 Pain in right ankle and joints of right foot: Secondary | ICD-10-CM | POA: Diagnosis not present

## 2022-04-22 DIAGNOSIS — I10 Essential (primary) hypertension: Secondary | ICD-10-CM | POA: Diagnosis not present

## 2022-04-22 DIAGNOSIS — S8991XA Unspecified injury of right lower leg, initial encounter: Secondary | ICD-10-CM | POA: Diagnosis not present

## 2022-05-04 DIAGNOSIS — Z79899 Other long term (current) drug therapy: Secondary | ICD-10-CM | POA: Diagnosis not present

## 2022-05-04 DIAGNOSIS — R1084 Generalized abdominal pain: Secondary | ICD-10-CM | POA: Diagnosis not present

## 2022-05-04 DIAGNOSIS — N179 Acute kidney failure, unspecified: Secondary | ICD-10-CM | POA: Diagnosis not present

## 2022-05-04 DIAGNOSIS — R109 Unspecified abdominal pain: Secondary | ICD-10-CM | POA: Diagnosis not present

## 2022-05-04 DIAGNOSIS — N1330 Unspecified hydronephrosis: Secondary | ICD-10-CM | POA: Diagnosis not present

## 2022-05-04 DIAGNOSIS — Z743 Need for continuous supervision: Secondary | ICD-10-CM | POA: Diagnosis not present

## 2022-05-05 DIAGNOSIS — I10 Essential (primary) hypertension: Secondary | ICD-10-CM | POA: Diagnosis not present

## 2022-05-05 DIAGNOSIS — I493 Ventricular premature depolarization: Secondary | ICD-10-CM | POA: Diagnosis not present

## 2022-05-05 DIAGNOSIS — K862 Cyst of pancreas: Secondary | ICD-10-CM | POA: Diagnosis not present

## 2022-05-05 DIAGNOSIS — K5790 Diverticulosis of intestine, part unspecified, without perforation or abscess without bleeding: Secondary | ICD-10-CM | POA: Diagnosis not present

## 2022-05-05 DIAGNOSIS — N130 Hydronephrosis with ureteropelvic junction obstruction: Secondary | ICD-10-CM | POA: Diagnosis not present

## 2022-05-05 DIAGNOSIS — I771 Stricture of artery: Secondary | ICD-10-CM | POA: Diagnosis not present

## 2022-05-05 DIAGNOSIS — N1330 Unspecified hydronephrosis: Secondary | ICD-10-CM | POA: Diagnosis not present

## 2022-05-05 DIAGNOSIS — Z931 Gastrostomy status: Secondary | ICD-10-CM | POA: Diagnosis not present

## 2022-05-05 DIAGNOSIS — E785 Hyperlipidemia, unspecified: Secondary | ICD-10-CM | POA: Diagnosis not present

## 2022-05-05 DIAGNOSIS — I774 Celiac artery compression syndrome: Secondary | ICD-10-CM | POA: Diagnosis not present

## 2022-05-05 DIAGNOSIS — E876 Hypokalemia: Secondary | ICD-10-CM | POA: Diagnosis not present

## 2022-05-05 DIAGNOSIS — I700 Atherosclerosis of aorta: Secondary | ICD-10-CM | POA: Diagnosis not present

## 2022-05-05 DIAGNOSIS — K551 Chronic vascular disorders of intestine: Secondary | ICD-10-CM | POA: Diagnosis not present

## 2022-05-05 DIAGNOSIS — I2510 Atherosclerotic heart disease of native coronary artery without angina pectoris: Secondary | ICD-10-CM | POA: Diagnosis not present

## 2022-05-05 DIAGNOSIS — Z853 Personal history of malignant neoplasm of breast: Secondary | ICD-10-CM | POA: Diagnosis not present

## 2022-05-05 DIAGNOSIS — N131 Hydronephrosis with ureteral stricture, not elsewhere classified: Secondary | ICD-10-CM | POA: Diagnosis not present

## 2022-05-05 DIAGNOSIS — K558 Other vascular disorders of intestine: Secondary | ICD-10-CM | POA: Diagnosis not present

## 2022-05-06 DIAGNOSIS — Z951 Presence of aortocoronary bypass graft: Secondary | ICD-10-CM | POA: Diagnosis not present

## 2022-05-06 DIAGNOSIS — N1330 Unspecified hydronephrosis: Secondary | ICD-10-CM | POA: Diagnosis not present

## 2022-05-06 DIAGNOSIS — K5790 Diverticulosis of intestine, part unspecified, without perforation or abscess without bleeding: Secondary | ICD-10-CM | POA: Diagnosis not present

## 2022-05-06 DIAGNOSIS — I700 Atherosclerosis of aorta: Secondary | ICD-10-CM | POA: Diagnosis not present

## 2022-05-06 DIAGNOSIS — N9989 Other postprocedural complications and disorders of genitourinary system: Secondary | ICD-10-CM | POA: Diagnosis not present

## 2022-05-06 DIAGNOSIS — R140 Abdominal distension (gaseous): Secondary | ICD-10-CM | POA: Diagnosis not present

## 2022-05-06 DIAGNOSIS — E876 Hypokalemia: Secondary | ICD-10-CM | POA: Diagnosis not present

## 2022-05-06 DIAGNOSIS — Z960 Presence of urogenital implants: Secondary | ICD-10-CM | POA: Diagnosis not present

## 2022-05-06 DIAGNOSIS — R110 Nausea: Secondary | ICD-10-CM | POA: Diagnosis not present

## 2022-05-06 DIAGNOSIS — N131 Hydronephrosis with ureteral stricture, not elsewhere classified: Secondary | ICD-10-CM | POA: Diagnosis not present

## 2022-05-06 DIAGNOSIS — K551 Chronic vascular disorders of intestine: Secondary | ICD-10-CM | POA: Diagnosis not present

## 2022-05-06 DIAGNOSIS — N130 Hydronephrosis with ureteropelvic junction obstruction: Secondary | ICD-10-CM | POA: Diagnosis not present

## 2022-05-06 DIAGNOSIS — I10 Essential (primary) hypertension: Secondary | ICD-10-CM | POA: Diagnosis not present

## 2022-05-06 DIAGNOSIS — E785 Hyperlipidemia, unspecified: Secondary | ICD-10-CM | POA: Diagnosis not present

## 2022-05-06 DIAGNOSIS — Z48816 Encounter for surgical aftercare following surgery on the genitourinary system: Secondary | ICD-10-CM | POA: Diagnosis not present

## 2022-05-06 DIAGNOSIS — N135 Crossing vessel and stricture of ureter without hydronephrosis: Secondary | ICD-10-CM | POA: Diagnosis not present

## 2022-05-06 DIAGNOSIS — K862 Cyst of pancreas: Secondary | ICD-10-CM | POA: Diagnosis not present

## 2022-05-06 DIAGNOSIS — I2510 Atherosclerotic heart disease of native coronary artery without angina pectoris: Secondary | ICD-10-CM | POA: Diagnosis not present

## 2022-05-06 DIAGNOSIS — I774 Celiac artery compression syndrome: Secondary | ICD-10-CM | POA: Diagnosis not present

## 2022-05-07 DIAGNOSIS — E876 Hypokalemia: Secondary | ICD-10-CM | POA: Diagnosis not present

## 2022-05-07 DIAGNOSIS — K862 Cyst of pancreas: Secondary | ICD-10-CM | POA: Diagnosis not present

## 2022-05-07 DIAGNOSIS — I774 Celiac artery compression syndrome: Secondary | ICD-10-CM | POA: Diagnosis not present

## 2022-05-07 DIAGNOSIS — Z960 Presence of urogenital implants: Secondary | ICD-10-CM | POA: Diagnosis not present

## 2022-05-07 DIAGNOSIS — K551 Chronic vascular disorders of intestine: Secondary | ICD-10-CM | POA: Diagnosis not present

## 2022-05-07 DIAGNOSIS — N131 Hydronephrosis with ureteral stricture, not elsewhere classified: Secondary | ICD-10-CM | POA: Diagnosis not present

## 2022-05-07 DIAGNOSIS — I10 Essential (primary) hypertension: Secondary | ICD-10-CM | POA: Diagnosis not present

## 2022-05-07 DIAGNOSIS — K5790 Diverticulosis of intestine, part unspecified, without perforation or abscess without bleeding: Secondary | ICD-10-CM | POA: Diagnosis not present

## 2022-05-07 DIAGNOSIS — N130 Hydronephrosis with ureteropelvic junction obstruction: Secondary | ICD-10-CM | POA: Diagnosis not present

## 2022-05-07 DIAGNOSIS — I2510 Atherosclerotic heart disease of native coronary artery without angina pectoris: Secondary | ICD-10-CM | POA: Diagnosis not present

## 2022-05-07 DIAGNOSIS — I700 Atherosclerosis of aorta: Secondary | ICD-10-CM | POA: Diagnosis not present

## 2022-05-08 ENCOUNTER — Telehealth: Payer: Self-pay | Admitting: *Deleted

## 2022-05-08 ENCOUNTER — Encounter: Payer: Self-pay | Admitting: Cardiology

## 2022-05-08 ENCOUNTER — Ambulatory Visit: Payer: Medicare PPO | Admitting: Cardiology

## 2022-05-08 VITALS — BP 140/90 | HR 80 | Ht 61.0 in | Wt 110.0 lb

## 2022-05-08 DIAGNOSIS — I4510 Unspecified right bundle-branch block: Secondary | ICD-10-CM | POA: Diagnosis not present

## 2022-05-08 DIAGNOSIS — R001 Bradycardia, unspecified: Secondary | ICD-10-CM

## 2022-05-08 DIAGNOSIS — I493 Ventricular premature depolarization: Secondary | ICD-10-CM | POA: Diagnosis not present

## 2022-05-08 DIAGNOSIS — E782 Mixed hyperlipidemia: Secondary | ICD-10-CM | POA: Diagnosis not present

## 2022-05-08 DIAGNOSIS — I10 Essential (primary) hypertension: Secondary | ICD-10-CM | POA: Diagnosis not present

## 2022-05-08 DIAGNOSIS — I25118 Atherosclerotic heart disease of native coronary artery with other forms of angina pectoris: Secondary | ICD-10-CM

## 2022-05-08 MED ORDER — METOPROLOL TARTRATE 25 MG PO TABS: 12.5000 mg | ORAL_TABLET | Freq: Two times a day (BID) | ORAL | 3 refills | Status: DC

## 2022-05-08 NOTE — Telephone Encounter (Signed)
add on today in an open slot. seen at [facility] for a "short hospital stay" for "blocked ureter" and they changed quite a few meds. wanted to see Dr. Bettina Gavia about medication management. Son made appt, and is going to go talk her into coming this evening. Just a heads up

## 2022-05-08 NOTE — Patient Instructions (Signed)
Medication Instructions:  Your physician has recommended you make the following change in your medication:   START: Metoprolol tartrate 12.5 mg twice daily  *If you need a refill on your cardiac medications before your next appointment, please call your pharmacy*   Lab Work: None If you have labs (blood work) drawn today and your tests are completely normal, you will receive your results only by: [place] (if you have MyChart) OR A paper copy in the mail If you have any lab test that is abnormal or we need to change your treatment, we will call you to review the results.   Testing/Procedures: None   Follow-Up: At [facility], you and your health needs are our priority.  As part of our continuing mission to provide you with exceptional heart care, we have created designated Provider Care Teams.  These Care Teams include your primary Cardiologist (physician) and Advanced Practice Providers (APPs -  Physician Assistants and Nurse Practitioners) who all work together to provide you with the care you need, when you need it.  We recommend signing up for the patient portal called "MyChart".  Sign up information is provided on this After Visit Summary.  MyChart is used to connect with patients for Virtual Visits (Telemedicine).  Patients are able to view lab/test results, encounter notes, upcoming appointments, etc.  Non-urgent messages can be sent to your provider as well.   To learn more about what you can do with MyChart, go to [email or web address].    Your next appointment:   6 month(s)  The format for your next appointment:   In Person  Provider:   Shirlee More, MD    Other Instructions Purchase a digital blood pressure device.  Check blood pressure and heart rate daily and record. Contact if heart rate is less than 50.   Important Information About [place]

## 2022-05-08 NOTE — Progress Notes (Signed)
Cardiology Office Note:    Date:  05/08/2022   ID:  Christine Rich, DOB 1933-11-15, MRN [id number]  PCP:  Street, Sharon Mt, MD  Cardiologist:  Shirlee More, MD    Referring MD: [address], Sharon Mt, *    ASSESSMENT:    1. PVCs (premature ventricular contractions)   2. Bradycardia   3. Coronary artery disease of native artery of native heart with stable angina pectoris ([place])   4. Essential hypertension   5. Mixed hyperlipidemia    PLAN:    In order of problems listed above:  Including hospital records the predominant problem is pulse deficit due to PVCs I think she should be on a beta-blocker with her coronary disease we will reduce the dose by 50% and trend with a home blood pressure cuff. Stable CAD no evidence of acute ischemia in the hospital continue medical treatment including aspirin clopidogrel and her statin Stable hypertension continue current treatment including losartan and has controlled her blood pressure   Next appointment: 6 months   Medication Adjustments/Labs and Tests Ordered: Current medicines are reviewed at length with the patient today.  Concerns regarding medicines are outlined above.  No orders of the defined types were placed in this encounter.  No orders of the defined types were placed in this encounter.   Chief Complaint  Patient presents with   Follow-up    History of Present Illness:    Christine Rich is a 88 y.o. female with a hx of CAD s/p CABG 2013 and NSTEMI 10/17/16 with PCi and DES to SVG to PDA EF 55-60%, DLD, HTN  last seen 03/17/2022. Compliance with diet, lifestyle and medications: Yes  She had recent cystoscopy and stent left kidney [facility] for obstructive uropathy I reviewed her records while there she had PVCs and what was felt to be bradycardia but the nursing notes said that it was really ventricular bigeminy Regardless her beta-blocker was stopped She had no cardiovascular complications chest  pain edema shortness of breath or syncope She has significant CAD and needs to restart her beta-blocker I will put her on a half dose asked to purchase a digital blood pressure cuff check record trend and contact us if her rates are less than 50 I think much of the bradycardia arrhythmias related to pain surgery anesthesia. Past Medical History:  Diagnosis Date   Abnormal CXR 06/13/2019   Accelerated hypertension 03/15/2022   Acute bronchitis 03/15/2022   Angina at rest [facility]) 03/15/2022   Anxiety 03/15/2022   Anxiety and depression 03/15/2022   Last Assessment & Plan:  Formatting of this note might be different from the original. - not on meds at home, clinically very anxious, noted received buspirone '5mg'$  x1 at midnight  - support provided   Boxers fracture 03/15/2022   CAD (coronary artery disease) of artery bypass graft 03/15/2022   Chest pain 06/06/2019   Last Assessment & Plan:  Formatting of this note might be different from the original. - intermittent chest pain over the past 1-2 weeks, currently pain free   - will check 2nd trop, CK today - EKG non-specific, start telemetry monitor  - cardiology consult pending  - Echo pending   Coronary artery disease involving native coronary artery of native heart 11/17/2015   Overview:  s/p CABGSx5 in 2003Lexiscan MPS in Aug 2011 without ischemia, EF 65% Cardiolite 10/29/12: CONCLUSIONS:  Study quality: fair, with obvious inferior attenuation artifact reducing the sensitivity of the test  (1) diaphragm  attenuation artifact - prior non-transmural infarction cannot be excluded  (2) normal LV size, with ? inferior hypokinesis - measured LVEF of 48% may be an underestimate  (3) no areas of (vasodilator) stress-induced hypoperfusion are identified.  Negative perfusion stress test for potenital ischemia.   Cardiac catheterization January 2018 drug-eluting stent to graft going to the posterior descending artery   Cough 06/13/2019   Cyst of skin and subcutaneous tissue  10/12/2020   Dyspnea on exertion 03/15/2022   Essential hypertension 11/17/2015   GERD (gastroesophageal reflux disease) 03/15/2022   Hx of CABG 03/15/2022   Hyperlipemia 11/17/2015   Hyperlipidemia 03/15/2022   Left against medical advice 03/15/2022   Malignant neoplasm of left female breast ([place]) 01/23/2018   Neck mass 10/11/2020   Nontoxic multinodular goiter 08/14/2016   NSTEMI (non-ST elevated myocardial infarction) ([place]) 10/18/2016   Orthopnea 03/15/2022   Pancreatic cyst 03/15/2022   Paresthesia 11/27/2019   Posterior neck pain 01/23/2018   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 - cough, fatigue, weakness for 1-2 weeks - nonspecific symptoms, will r/o COVID-19   Thyroid mass 11/30/2020   Vitamin B 12 deficiency    Weakness 06/06/2019   Last Assessment & Plan:  Formatting of this note might be different from the original. - TSH WNL - B12 low, replacement as below  - r/o ACS, COVID-19, likely due to age and debility , PT to see    Past Surgical History:  Procedure Laterality Date   ABDOMINAL HYSTERECTOMY     CORONARY ARTERY BYPASS GRAFT     MASTECTOMY      Current Medications: Current Meds  Medication Sig   aspirin EC 81 MG tablet Take 81 mg by mouth daily.   cefdinir (OMNICEF) 300 MG capsule Take 300 mg by mouth 2 (two) times daily. For 7 days   cetirizine (ZYRTEC) 10 MG tablet Take 1 tablet by mouth daily.   clopidogrel (PLAVIX) 75 MG tablet Take 1 tablet (75 mg total) by mouth daily.   cyanocobalamin 1000 MCG tablet Take 1,000 mcg by mouth daily.   isosorbide mononitrate (IMDUR) 30 MG 24 hr tablet Take 1 tablet (30 mg total) by mouth daily.   losartan (COZAAR) 25 MG tablet Take 25 mg by mouth daily.     Multiple Vitamins-Minerals (PRESERVISION AREDS 2 PO) Take 1 tablet by mouth daily.   nitroGLYCERIN (NITROSTAT) 0.4 MG SL tablet PLACE 1 TABLET UNDER THE TONGUE EVERY 5 MINUTES FOR UP TO 3 DOSES AS NEEDED FOR CHEST PAIN. CALL 911 IF PAIN PERSISTS.   rosuvastatin (CRESTOR) 40 MG tablet Take 1 tablet (40 mg total) by mouth daily.     Allergies:   Patient has no known allergies.   Social History   Socioeconomic History   Marital status: Married    Spouse name: Not on file   Number of children: Not on file   Years of education: Not on file   Highest education level: Not on file  Occupational History   Not on file  Tobacco Use   Smoking status: Never   Smokeless tobacco: Never  Vaping Use   Vaping Use: Never used  Substance and Sexual Activity   Alcohol use: No   Drug use: No   Sexual activity: Not  on file  Other Topics Concern   Not on file  Social History Narrative   Not on file   Social Determinants of Health   Financial Resource Strain: Not on file  Food Insecurity: Not on file  Transportation Needs: Not on file  Physical Activity: Not on file  Stress: Not on file  Social Connections: Not on file     Family History: The patient's family history includes Heart disease in her father and mother. ROS:   Please see the history of present illness.    All other systems reviewed and are negative.  EKGs/Labs/Other Studies Reviewed:    The following studies were reviewed today:  EKG:  EKG yesterday [facility] sinus bradycardia with frequent PVCs heart rate 54 bpm  Recent Labs: 03/17/2022: ALT 16; BUN 14; Creatinine, Ser 0.92; Potassium 4.2; Sodium 143  Recent Lipid Panel    Component Value Date/Time   CHOL 214 (H) 03/17/2022 1109   TRIG 83 03/17/2022 1109   HDL 50 03/17/2022 1109   CHOLHDL 4.3 03/17/2022 1109   LDLCALC 149 (H) 03/17/2022 1109    Physical Exam:    VS:  BP 140/90 (BP Location: Right Arm, Patient Position: Sitting, Cuff Size: Small)    Pulse 80   Ht '5\' 1"'$  (1.549 m)   Wt 110 lb (49.9 kg)   SpO2 99%   BMI 20.78 kg/m     Wt Readings from Last 3 Encounters:  05/08/22 110 lb (49.9 kg)  03/17/22 113 lb 12.8 oz (51.6 kg)  01/05/20 143 lb (64.9 kg)     GEN:  Well nourished, well developed in no acute distress HEENT: Normal NECK: No JVD; No carotid bruits LYMPHATICS: No lymphadenopathy CARDIAC: RRR, no murmurs, rubs, gallops RESPIRATORY:  Clear to auscultation without rales, wheezing or rhonchi  ABDOMEN: Soft, non-tender, non-distended MUSCULOSKELETAL:  No edema; No deformity  SKIN: Warm and dry NEUROLOGIC:  Alert and oriented x 3 PSYCHIATRIC:  Normal affect    Signed, Shirlee More, MD  05/08/2022 3:00 PM    [place]

## 2022-05-09 ENCOUNTER — Telehealth: Payer: Self-pay | Admitting: Cardiology

## 2022-05-09 ENCOUNTER — Other Ambulatory Visit: Payer: Self-pay

## 2022-05-09 MED ORDER — ISOSORBIDE MONONITRATE ER 30 MG PO TB24: 30.0000 mg | ORAL_TABLET | Freq: Every day | ORAL | 3 refills | Status: DC

## 2022-05-09 MED ORDER — LOSARTAN POTASSIUM 25 MG PO TABS: 25.0000 mg | ORAL_TABLET | Freq: Every day | ORAL | 3 refills | Status: AC

## 2022-05-09 NOTE — Telephone Encounter (Signed)
Patient needs refill on  IMDUR ER '30mg'$  tab Losartan 25 mg tab

## 2022-05-09 NOTE — Telephone Encounter (Signed)
Called patient and informed her that the refills for her medications were sent to her pharmacy. Patient had no further questions at this time.

## 2022-05-10 ENCOUNTER — Telehealth: Payer: Self-pay

## 2022-05-15 DIAGNOSIS — E079 Disorder of thyroid, unspecified: Secondary | ICD-10-CM | POA: Diagnosis not present

## 2022-05-15 DIAGNOSIS — I10 Essential (primary) hypertension: Secondary | ICD-10-CM | POA: Diagnosis not present

## 2022-05-15 DIAGNOSIS — Z0000 Encounter for general adult medical examination without abnormal findings: Secondary | ICD-10-CM | POA: Diagnosis not present

## 2022-05-15 DIAGNOSIS — M6281 Muscle weakness (generalized): Secondary | ICD-10-CM | POA: Diagnosis not present

## 2022-05-15 DIAGNOSIS — N135 Crossing vessel and stricture of ureter without hydronephrosis: Secondary | ICD-10-CM | POA: Diagnosis not present

## 2022-05-15 DIAGNOSIS — E785 Hyperlipidemia, unspecified: Secondary | ICD-10-CM | POA: Diagnosis not present

## 2022-05-15 DIAGNOSIS — Z79899 Other long term (current) drug therapy: Secondary | ICD-10-CM | POA: Diagnosis not present

## 2022-05-15 DIAGNOSIS — R7302 Impaired glucose tolerance (oral): Secondary | ICD-10-CM | POA: Diagnosis not present

## 2022-05-15 DIAGNOSIS — E049 Nontoxic goiter, unspecified: Secondary | ICD-10-CM | POA: Diagnosis not present

## 2022-05-20 DIAGNOSIS — R300 Dysuria: Secondary | ICD-10-CM | POA: Diagnosis not present

## 2022-05-23 DIAGNOSIS — N130 Hydronephrosis with ureteropelvic junction obstruction: Secondary | ICD-10-CM | POA: Diagnosis not present

## 2022-05-23 DIAGNOSIS — T191XXA Foreign body in bladder, initial encounter: Secondary | ICD-10-CM | POA: Diagnosis not present

## 2022-06-09 DIAGNOSIS — N130 Hydronephrosis with ureteropelvic junction obstruction: Secondary | ICD-10-CM | POA: Diagnosis not present

## 2022-06-19 DIAGNOSIS — N1330 Unspecified hydronephrosis: Secondary | ICD-10-CM | POA: Diagnosis not present

## 2022-06-19 DIAGNOSIS — R109 Unspecified abdominal pain: Secondary | ICD-10-CM | POA: Diagnosis not present

## 2022-06-19 DIAGNOSIS — K8689 Other specified diseases of pancreas: Secondary | ICD-10-CM | POA: Diagnosis not present

## 2022-06-19 DIAGNOSIS — K838 Other specified diseases of biliary tract: Secondary | ICD-10-CM | POA: Diagnosis not present

## 2022-06-21 DIAGNOSIS — N135 Crossing vessel and stricture of ureter without hydronephrosis: Secondary | ICD-10-CM | POA: Diagnosis not present

## 2022-07-06 DIAGNOSIS — J069 Acute upper respiratory infection, unspecified: Secondary | ICD-10-CM | POA: Diagnosis not present

## 2022-07-06 DIAGNOSIS — R0981 Nasal congestion: Secondary | ICD-10-CM | POA: Diagnosis not present

## 2022-07-06 DIAGNOSIS — R051 Acute cough: Secondary | ICD-10-CM | POA: Diagnosis not present

## 2022-07-27 DIAGNOSIS — N289 Disorder of kidney and ureter, unspecified: Secondary | ICD-10-CM | POA: Diagnosis not present

## 2022-07-27 DIAGNOSIS — N130 Hydronephrosis with ureteropelvic junction obstruction: Secondary | ICD-10-CM | POA: Diagnosis not present

## 2022-09-18 ENCOUNTER — Encounter: Payer: Self-pay | Admitting: Allergy and Immunology

## 2022-09-18 ENCOUNTER — Telehealth: Payer: Self-pay | Admitting: *Deleted

## 2022-09-18 ENCOUNTER — Ambulatory Visit (INDEPENDENT_AMBULATORY_CARE_PROVIDER_SITE_OTHER): Payer: Medicare PPO | Admitting: Allergy and Immunology

## 2022-09-18 VITALS — BP 130/66 | HR 65 | Temp 97.5°F | Resp 20 | Ht 59.5 in | Wt 110.0 lb

## 2022-09-18 DIAGNOSIS — H6122 Impacted cerumen, left ear: Secondary | ICD-10-CM | POA: Diagnosis not present

## 2022-09-18 DIAGNOSIS — J3089 Other allergic rhinitis: Secondary | ICD-10-CM | POA: Diagnosis not present

## 2022-09-18 DIAGNOSIS — H903 Sensorineural hearing loss, bilateral: Secondary | ICD-10-CM | POA: Diagnosis not present

## 2022-09-18 DIAGNOSIS — H6993 Unspecified Eustachian tube disorder, bilateral: Secondary | ICD-10-CM | POA: Diagnosis not present

## 2022-09-18 NOTE — Telephone Encounter (Signed)
Please place referral to [facility] ENT and audiology for ear wax issue and hearing aid per Dr. Neldon Mc. Thank you.   [address] [place], [place] [address] Phone: [phone] Fax: [phone]

## 2022-09-18 NOTE — Progress Notes (Unsigned)
[place] - [place] - [place]   Dear Street,  Thank you for referring Christine Rich to the [facility] on 09/18/2022.   Below is a summation of this patient's evaluation and recommendations.  Thank you for your referral. I will keep you informed about this patient's response to treatment.   If you have any questions please do not hesitate to contact me.   Sincerely,  Jiles Prows, MD Allergy / Immunology [place]   ______________________________________________________________________    NEW PATIENT NOTE  Referring Provider: Street, Christine Rich, * Primary Provider: Street, Christine Mt, MD Date of office visit: 09/18/2022    Subjective:   Chief Complaint:  Christine Rich (DOB: [birth year]) is a 88 y.o. female who presents to the clinic on 09/18/2022 with a chief complaint of Allergies (seasonal) .     HPI: [name] returns to this clinic in evaluation of allergies.  She states that she has a long history of problems with "allergies" but they have become somewhat worse.  Fall 2023.  She complains of having constant runny nose especially when she eats, sneezing, coughing, throat clearing and her ears are stopped up.  She does not have any anosmia and she does not have a history of ugly nasal discharge or recurrent sinusitis.  She does not have a history consistent with reflux.  Her ears are stopped up and they have been stopped up for many many years and she has seen an ENT doctor in [facility] in the past for this issue.  She cannot hear very well at all.  She is not that interested in getting hearing aids because it makes her look old.  She has received COVID-vaccine but has not received flu vaccine or RSV vaccine.  Past Medical History:  Diagnosis Date   Abnormal CXR 06/13/2019   Accelerated hypertension 03/15/2022   Acute bronchitis 03/15/2022    Angina at rest 03/15/2022   Anxiety 03/15/2022   Anxiety and depression 03/15/2022   Last Assessment & Plan:  Formatting of this note might be different from the original. - not on meds at home, clinically very anxious, noted received buspirone '5mg'$  x1 at midnight  - support provided   Boxers fracture 03/15/2022   CAD (coronary artery disease) of artery bypass graft 03/15/2022   Chest pain 06/06/2019   Last Assessment & Plan:  Formatting of this note might be different from the original. - intermittent chest pain over the past 1-2 weeks, currently pain free   - will check 2nd trop, CK today - EKG non-specific, start telemetry monitor  - cardiology consult pending  - Echo pending   Coronary artery disease involving native coronary artery of native heart 11/17/2015   Overview:  s/p CABGSx5 in 2003Lexiscan MPS in Aug 2011 without ischemia, EF 65% Cardiolite 10/29/12: CONCLUSIONS:  Study quality: fair, with obvious inferior attenuation artifact reducing the sensitivity of the test  (1) diaphragm attenuation artifact - prior non-transmural infarction cannot be excluded  (2) normal LV size, with ? inferior hypokinesis - measured LVEF of 48% may be an underestimate  (3) no areas of (vasodilator) stress-induced hypoperfusion are identified.  Negative perfusion stress test for potenital ischemia.   Cardiac catheterization January 2018 drug-eluting stent to graft going to the posterior descending artery   Cough 06/13/2019   Cyst of skin and subcutaneous tissue 10/12/2020   Dyspnea on exertion 03/15/2022  Essential hypertension 11/17/2015   GERD (gastroesophageal reflux disease) 03/15/2022   Hx of CABG 03/15/2022   Hyperlipemia 11/17/2015   Hyperlipidemia 03/15/2022   Left against medical advice 03/15/2022   Malignant neoplasm of left female breast ([place]) 01/23/2018   Neck mass 10/11/2020   Nontoxic multinodular goiter 08/14/2016   NSTEMI (non-ST elevated myocardial infarction) ([place]) 10/18/2016   Orthopnea 03/15/2022    Pancreatic cyst 03/15/2022   Paresthesia 11/27/2019   Posterior neck pain 01/23/2018   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 - cough, fatigue, weakness for 1-2 weeks - nonspecific symptoms, will r/o COVID-19   Thyroid mass 11/30/2020   Vitamin B 12 deficiency    Weakness 06/06/2019   Last Assessment & Plan:  Formatting of this note might be different from the original. - TSH WNL - B12 low, replacement as below  - r/o ACS, COVID-19, likely due to age and debility , PT to see    Past Surgical History:  Procedure Laterality Date   ABDOMINAL HYSTERECTOMY     CORONARY ARTERY BYPASS GRAFT     MASTECTOMY      Allergies as of 09/18/2022   No Known Allergies      Medication List    aspirin EC 81 MG tablet Take 81 mg by mouth daily.   cetirizine 10 MG tablet Commonly known as: ZYRTEC Take 1 tablet by mouth daily.   clopidogrel 75 MG tablet Commonly known as: PLAVIX Take 1 tablet (75 mg total) by mouth daily.   cyanocobalamin 1000 MCG tablet Take 1,000 mcg by mouth daily.   isosorbide mononitrate 30 MG 24 hr tablet Commonly known as: IMDUR Take 1 tablet (30 mg total) by mouth daily.   losartan 25 MG tablet Commonly known as: COZAAR Take 1 tablet (25 mg total) by mouth daily.   metoprolol tartrate 25 MG tablet Commonly known as: LOPRESSOR Take 0.5 tablets (12.5 mg total) by mouth 2 (two) times daily.   nitroGLYCERIN 0.4 MG SL tablet Commonly known as: NITROSTAT PLACE 1 TABLET UNDER THE TONGUE EVERY 5 MINUTES FOR UP TO 3 DOSES AS NEEDED FOR CHEST PAIN. CALL 911 IF PAIN PERSISTS.   PRESERVISION AREDS 2 PO Take 1 tablet by mouth daily.   rosuvastatin 40 MG tablet Commonly known as:  CRESTOR Take 1 tablet (40 mg total) by mouth daily.   traMADol 50 MG tablet Commonly known as: ULTRAM Take by mouth every 6 (six) hours as needed.    Review of systems negative except as noted in HPI / PMHx or noted below:  Review of Systems  Constitutional: Negative.   HENT: Negative.    Eyes: Negative.   Respiratory: Negative.    Cardiovascular: Negative.   Gastrointestinal: Negative.   Genitourinary: Negative.   Musculoskeletal: Negative.   Skin: Negative.   Neurological: Negative.   Endo/Heme/Allergies: Negative.   Psychiatric/Behavioral: Negative.      Family History  Problem Relation Age of Onset   Heart disease Mother    Heart disease Father     Social History   Socioeconomic History   Marital status: Married    Spouse name: Not on file   Number of children: Not on file   Years of education: Not on file   Highest education level:  Not on file  Occupational History   Not on file  Tobacco Use   Smoking status: Never   Smokeless tobacco: Never  Vaping Use   Vaping Use: Never used  Substance and Sexual Activity   Alcohol use: No   Drug use: No   Sexual activity: Not on file  Other Topics Concern   Not on file  Social History Narrative   Not on file   Environmental and Social history  Lives in a house with a dry environment, a dog located inside the household, no carpeting in the bedroom, no plastic on the bed, no plastic on the pillow, and no smoking ongoing with inside the household.  Objective:   Vitals:   09/18/22 0915  BP: 130/66  Pulse: 65  Resp: 20  Temp: (!) 97.5 F (36.4 C)  SpO2: 99%   Height: 4' 11.5" (151.1 cm) Weight: 110 lb (49.9 kg)  Physical Exam Constitutional:      Appearance: She is not diaphoretic.  HENT:     Head: Normocephalic.     Right Ear: Tympanic membrane, ear canal and external ear normal.     Left Ear: External ear normal. There is impacted cerumen.     Nose: Nose normal. No mucosal edema or rhinorrhea.      Mouth/Throat:     Pharynx: Uvula midline. No oropharyngeal exudate.  Eyes:     Conjunctiva/sclera: Conjunctivae normal.  Neck:     Thyroid: No thyromegaly.     Trachea: Trachea normal. No tracheal tenderness or tracheal deviation.  Cardiovascular:     Rate and Rhythm: Normal rate and regular rhythm.     Heart sounds: Normal heart sounds, S1 normal and S2 normal. No murmur heard. Pulmonary:     Effort: No respiratory distress.     Breath sounds: Normal breath sounds. No stridor. No wheezing or rales.  Lymphadenopathy:     Head:     Right side of head: No tonsillar adenopathy.     Left side of head: No tonsillar adenopathy.     Cervical: No cervical adenopathy.  Skin:    Findings: No erythema or rash.     Nails: There is no clubbing.  Neurological:     Mental Status: She is alert.     Diagnostics: Allergy skin tests were performed.    Assessment and Plan:    1. Perennial allergic rhinitis   2. Dysfunction of both eustachian tubes   3. Impacted cerumen of left ear   4. Sensorineural hearing loss (SNHL) of both ears     Patient Instructions   1.  Allergen avoidance measures??? Check blood - Area 2 panel, CBC w/d  2.  Consistently use the following medications:   A. OTC Nasacort - 1 spray each nostril 2 time per day (AM + PM)  B. Ipratropium 0.06% - 2 sprays each nostril 2 times per day (AM + PM)  3. Evaluation with ENT and audiology for wax issue and hearing aid  4.  Obtain Flu vaccine.  Obtain RSV vaccine  5.  Return to clinic in 4 weeks or earlier if problem     Jiles Prows, MD Allergy / Immunology [facility]

## 2022-09-18 NOTE — Patient Instructions (Addendum)
  1.  Allergen avoidance measures??? Check blood - Area 2 panel, CBC w/d  2.  Consistently use the following medications:   A. OTC Nasacort - 1 spray each nostril 2 time per day (AM + PM)  B. Ipratropium 0.06% - 2 sprays each nostril 2 times per day (AM + PM)  3. Evaluation with ENT and audiology for wax issue and hearing aid  4.  Obtain Flu vaccine.  Obtain RSV vaccine  5.  Return to clinic in 4 weeks or earlier if problem

## 2022-09-19 ENCOUNTER — Encounter: Payer: Self-pay | Admitting: Allergy and Immunology

## 2022-09-25 DIAGNOSIS — R5383 Other fatigue: Secondary | ICD-10-CM | POA: Diagnosis not present

## 2022-09-25 DIAGNOSIS — E538 Deficiency of other specified B group vitamins: Secondary | ICD-10-CM | POA: Diagnosis not present

## 2022-09-25 DIAGNOSIS — F0390 Unspecified dementia without behavioral disturbance: Secondary | ICD-10-CM | POA: Diagnosis not present

## 2022-09-25 DIAGNOSIS — Z681 Body mass index (BMI) 19 or less, adult: Secondary | ICD-10-CM | POA: Diagnosis not present

## 2022-09-25 DIAGNOSIS — Z79899 Other long term (current) drug therapy: Secondary | ICD-10-CM | POA: Diagnosis not present

## 2022-09-25 DIAGNOSIS — F419 Anxiety disorder, unspecified: Secondary | ICD-10-CM | POA: Diagnosis not present

## 2022-09-25 DIAGNOSIS — J329 Chronic sinusitis, unspecified: Secondary | ICD-10-CM | POA: Diagnosis not present

## 2022-09-25 DIAGNOSIS — J40 Bronchitis, not specified as acute or chronic: Secondary | ICD-10-CM | POA: Diagnosis not present

## 2022-09-25 DIAGNOSIS — F331 Major depressive disorder, recurrent, moderate: Secondary | ICD-10-CM | POA: Diagnosis not present

## 2022-09-25 DIAGNOSIS — R5381 Other malaise: Secondary | ICD-10-CM | POA: Diagnosis not present

## 2022-09-26 DIAGNOSIS — M419 Scoliosis, unspecified: Secondary | ICD-10-CM | POA: Diagnosis not present

## 2022-09-26 DIAGNOSIS — F0390 Unspecified dementia without behavioral disturbance: Secondary | ICD-10-CM | POA: Diagnosis not present

## 2022-09-26 DIAGNOSIS — Z951 Presence of aortocoronary bypass graft: Secondary | ICD-10-CM | POA: Diagnosis not present

## 2022-09-26 DIAGNOSIS — N132 Hydronephrosis with renal and ureteral calculous obstruction: Secondary | ICD-10-CM | POA: Diagnosis not present

## 2022-09-26 DIAGNOSIS — K5900 Constipation, unspecified: Secondary | ICD-10-CM | POA: Diagnosis not present

## 2022-09-26 DIAGNOSIS — K5909 Other constipation: Secondary | ICD-10-CM | POA: Diagnosis not present

## 2022-09-26 DIAGNOSIS — R1032 Left lower quadrant pain: Secondary | ICD-10-CM | POA: Diagnosis not present

## 2022-09-26 DIAGNOSIS — N1330 Unspecified hydronephrosis: Secondary | ICD-10-CM | POA: Diagnosis not present

## 2022-09-26 DIAGNOSIS — I700 Atherosclerosis of aorta: Secondary | ICD-10-CM | POA: Diagnosis not present

## 2022-09-26 DIAGNOSIS — K5730 Diverticulosis of large intestine without perforation or abscess without bleeding: Secondary | ICD-10-CM | POA: Diagnosis not present

## 2022-09-26 NOTE — Telephone Encounter (Signed)
Referral has been faxed to [facility]   [address] [place],  [address] Phone: [phone] Fax: [phone]  Called and spoke to patient's husband. Advised him of referral and that they would reach out to schedule appointment. Patient verbalized understanding and stated they would wait for the call to schedule.

## 2022-09-27 DIAGNOSIS — I6381 Other cerebral infarction due to occlusion or stenosis of small artery: Secondary | ICD-10-CM | POA: Diagnosis not present

## 2022-09-27 DIAGNOSIS — R413 Other amnesia: Secondary | ICD-10-CM | POA: Diagnosis not present

## 2022-10-19 ENCOUNTER — Ambulatory Visit (INDEPENDENT_AMBULATORY_CARE_PROVIDER_SITE_OTHER): Payer: Medicare PPO | Admitting: Allergy and Immunology

## 2022-10-19 ENCOUNTER — Encounter: Payer: Self-pay | Admitting: Allergy and Immunology

## 2022-10-19 VITALS — BP 132/66 | HR 64 | Resp 16

## 2022-10-19 DIAGNOSIS — J3089 Other allergic rhinitis: Secondary | ICD-10-CM

## 2022-10-19 DIAGNOSIS — H903 Sensorineural hearing loss, bilateral: Secondary | ICD-10-CM | POA: Diagnosis not present

## 2022-10-19 MED ORDER — IPRATROPIUM BROMIDE 0.06 % NA SOLN: NASAL | 5 refills | Status: AC

## 2022-10-19 NOTE — Progress Notes (Signed)
[facility] [place] [facility] [place]   Follow-up Note  Referring Provider: Street, Sharon Mt, * Primary Provider: Street, Sharon Mt, MD Date of Office Visit: 10/19/2022  Subjective:   Christine Rich (DOB: 1934/06/17) is a 89 y.o. female who returns to the [facility] on 10/19/2022 in re-evaluation of the following:  HPI: Kamariyah returns to this clinic in evaluation of her nonallergic rhinitis with some laryngeal symptoms.  I last saw her in this clinic during her initial evaluation for December 2023.  She believes that her throat clearing and coughing is better.  She still has a little bit of runny nose.  It sounds as though she is just using her nasal steroid and not her nasal ipratropium.  She did not obtain the blood test we requested during her initial evaluation in investigation of her atopic disease.  She has an appointment to see a local ENT regarding her impacted cerumen and to get fitted for hearing aids.  Allergies as of 10/19/2022   No Known Allergies      Medication List    aspirin EC 81 MG tablet Take 81 mg by mouth daily.   cetirizine 10 MG tablet Commonly known as: ZYRTEC Take 1 tablet by mouth daily.   clopidogrel 75 MG tablet Commonly known as: PLAVIX Take 1 tablet (75 mg total) by mouth daily.   cyanocobalamin 1000 MCG tablet Take 1,000 mcg by mouth daily.   isosorbide mononitrate 30 MG 24 hr tablet Commonly known as: IMDUR Take 1 tablet (30 mg total) by mouth daily.   losartan 25 MG tablet Commonly known as: COZAAR Take 1 tablet (25 mg total) by mouth daily.   metoprolol tartrate 25 MG tablet Commonly known as: LOPRESSOR Take 0.5 tablets (12.5 mg total) by mouth 2 (two) times daily.   Nasacort Allergy 24HR 55 MCG/ACT Aero nasal inhaler Generic drug: triamcinolone Place 2 sprays into the nose daily.   nitroGLYCERIN 0.4 MG SL tablet Commonly known as: NITROSTAT PLACE 1 TABLET UNDER THE TONGUE EVERY  5 MINUTES FOR UP TO 3 DOSES AS NEEDED FOR CHEST PAIN. CALL 911 IF PAIN PERSISTS.   PRESERVISION AREDS 2 PO Take 1 tablet by mouth daily.   rosuvastatin 40 MG tablet Commonly known as: CRESTOR Take 1 tablet (40 mg total) by mouth daily.   traMADol 50 MG tablet Commonly known as: ULTRAM Take by mouth every 6 (six) hours as needed.    Past Medical History:  Diagnosis Date   Abnormal CXR 06/13/2019   Accelerated hypertension 03/15/2022   Acute bronchitis 03/15/2022   Angina at rest 03/15/2022   Anxiety 03/15/2022   Anxiety and depression 03/15/2022   Last Assessment & Plan:  Formatting of this note might be different from the original. - not on meds at home, clinically very anxious, noted received buspirone '5mg'$  x1 at midnight  - support provided   Boxers fracture 03/15/2022   CAD (coronary artery disease) of artery bypass graft 03/15/2022   Chest pain 06/06/2019   Last Assessment & Plan:  Formatting of this note might be different from the original. - intermittent chest pain over the past 1-2 weeks, currently pain free   - will check 2nd trop, CK today - EKG non-specific, start telemetry monitor  - cardiology consult pending  - Echo pending   Coronary artery disease involving native coronary artery of native heart 11/17/2015   Overview:  s/p CABGSx5 in 2003Lexiscan MPS in Aug 2011 without ischemia, EF  65% Cardiolite 10/29/12: CONCLUSIONS:  Study quality: fair, with obvious inferior attenuation artifact reducing the sensitivity of the test  (1) diaphragm attenuation artifact - prior non-transmural infarction cannot be excluded  (2) normal LV size, with ? inferior hypokinesis - measured LVEF of 48% may be an underestimate  (3) no areas of (vasodilator) stress-induced hypoperfusion are identified.  Negative perfusion stress test for potenital ischemia.   Cardiac catheterization January 2018 drug-eluting stent to graft going to the posterior descending artery   Cough 06/13/2019   Cyst of skin and  subcutaneous tissue 10/12/2020   Dyspnea on exertion 03/15/2022   Essential hypertension 11/17/2015   GERD (gastroesophageal reflux disease) 03/15/2022   Hx of CABG 03/15/2022   Hyperlipemia 11/17/2015   Hyperlipidemia 03/15/2022   Left against medical advice 03/15/2022   Malignant neoplasm of left female breast ([place]) 01/23/2018   Neck mass 10/11/2020   Nontoxic multinodular goiter 08/14/2016   NSTEMI (non-ST elevated myocardial infarction) ([place]) 10/18/2016   Orthopnea 03/15/2022   Pancreatic cyst 03/15/2022   Paresthesia 11/27/2019   Posterior neck pain 01/23/2018   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 - cough, fatigue, weakness for 1-2 weeks - nonspecific symptoms, will r/o COVID-19   Thyroid mass 11/30/2020   Vitamin B 12 deficiency    Weakness 06/06/2019   Last Assessment & Plan:  Formatting of this note might be different from the original. - TSH WNL - B12 low, replacement as below  - r/o ACS, COVID-19, likely due to age and debility , PT to see    Past Surgical History:  Procedure Laterality Date   ABDOMINAL HYSTERECTOMY     CORONARY ARTERY BYPASS GRAFT     MASTECTOMY      Review of systems negative except as noted in HPI / PMHx or noted below:  Review of Systems  Constitutional: Negative.   HENT: Negative.    Eyes: Negative.   Respiratory: Negative.    Cardiovascular: Negative.   Gastrointestinal: Negative.   Genitourinary: Negative.   Musculoskeletal: Negative.   Skin: Negative.   Neurological: Negative.   Endo/Heme/Allergies: Negative.   Psychiatric/Behavioral: Negative.       Objective:   Vitals:   10/19/22 1044  BP: 132/66  Pulse: 64  Resp: 16  SpO2: 97%          Physical  Exam Constitutional:      Appearance: She is not diaphoretic.  HENT:     Head: Normocephalic.     Right Ear: External ear normal. There is impacted cerumen.     Left Ear: External ear normal. There is impacted cerumen.     Nose: Nose normal. No mucosal edema or rhinorrhea.     Mouth/Throat:     Pharynx: Uvula midline. No oropharyngeal exudate.  Eyes:     Conjunctiva/sclera: Conjunctivae normal.  Neck:     Thyroid: No thyromegaly.     Trachea: Trachea normal. No tracheal tenderness or tracheal deviation.  Cardiovascular:     Rate and Rhythm: Normal rate and regular rhythm.     Heart sounds: Normal heart sounds, S1 normal and S2 normal. No murmur heard. Pulmonary:     Effort: No respiratory distress.     Breath sounds: Normal breath sounds. No stridor. No wheezing  or rales.  Lymphadenopathy:     Head:     Right side of head: No tonsillar adenopathy.     Left side of head: No tonsillar adenopathy.     Cervical: No cervical adenopathy.  Skin:    Findings: No erythema or rash.     Nails: There is no clubbing.  Neurological:     Mental Status: She is alert.     Diagnostics: none  Assessment and Plan:   1. Perennial allergic rhinitis   2. Sensorineural hearing loss (SNHL) of both ears    1.  Consistently use the following medications:   A. OTC Nasacort - 1 spray each nostril 2 time per day (AM + PM)  B. Ipratropium 0.06% - 2 sprays each nostril 2 times per day (AM + PM)  2. Evaluation with ENT and audiology for wax issue and hearing aid  3. Return to clinic in 6 months or earlier if problem  Jersie definitely needs a earwax cleaning and she definitely needs hearing aids and she will be visiting with Dr. Gaylyn Cheers, ENT, sometime this month to work through that issue.  She will continue on the 2 nasal sprays noted above which have certainly given her some improvement.  I will see her back in this clinic in 6 months or earlier if there is a problem.  Allena Katz, MD Allergy /  Immunology [place]

## 2022-10-19 NOTE — Patient Instructions (Addendum)
  1.  Consistently use the following medications:   A. OTC Nasacort - 1 spray each nostril 2 time per day (AM + PM)  B. Ipratropium 0.06% - 2 sprays each nostril 2 times per day (AM + PM)  2. Evaluation with ENT and audiology for wax issue and hearing aid  3. Return to clinic in 6 months or earlier if problem

## 2022-10-23 ENCOUNTER — Encounter: Payer: Self-pay | Admitting: Allergy and Immunology

## 2022-10-24 DIAGNOSIS — N130 Hydronephrosis with ureteropelvic junction obstruction: Secondary | ICD-10-CM | POA: Diagnosis not present

## 2022-11-02 DIAGNOSIS — F0390 Unspecified dementia without behavioral disturbance: Secondary | ICD-10-CM | POA: Diagnosis not present

## 2022-11-02 DIAGNOSIS — S0990XA Unspecified injury of head, initial encounter: Secondary | ICD-10-CM | POA: Diagnosis not present

## 2022-11-02 DIAGNOSIS — Z79899 Other long term (current) drug therapy: Secondary | ICD-10-CM | POA: Diagnosis not present

## 2022-11-02 DIAGNOSIS — I4510 Unspecified right bundle-branch block: Secondary | ICD-10-CM | POA: Diagnosis not present

## 2022-11-02 DIAGNOSIS — R531 Weakness: Secondary | ICD-10-CM | POA: Diagnosis not present

## 2022-11-02 DIAGNOSIS — R9431 Abnormal electrocardiogram [ECG] [EKG]: Secondary | ICD-10-CM | POA: Diagnosis not present

## 2022-11-02 DIAGNOSIS — K219 Gastro-esophageal reflux disease without esophagitis: Secondary | ICD-10-CM | POA: Diagnosis not present

## 2022-11-02 DIAGNOSIS — Z7902 Long term (current) use of antithrombotics/antiplatelets: Secondary | ICD-10-CM | POA: Diagnosis not present

## 2022-11-02 DIAGNOSIS — Z8659 Personal history of other mental and behavioral disorders: Secondary | ICD-10-CM | POA: Diagnosis not present

## 2022-11-13 ENCOUNTER — Ambulatory Visit (INDEPENDENT_AMBULATORY_CARE_PROVIDER_SITE_OTHER): Payer: Medicare PPO | Admitting: Neurology

## 2022-11-13 ENCOUNTER — Encounter: Payer: Self-pay | Admitting: Neurology

## 2022-11-13 VITALS — BP 160/98 | Ht 62.0 in | Wt 105.0 lb

## 2022-11-13 DIAGNOSIS — R413 Other amnesia: Secondary | ICD-10-CM | POA: Diagnosis not present

## 2022-11-13 NOTE — Progress Notes (Signed)
Subjective:    Patient ID: Christine Rich is a 89 y.o. female.  HPI    Star Age, MD, PhD [facility] [address], [address] [place], [place] [address]   Dear Dr. Lin Landsman,  I saw your patient, Christine Rich, upon your kind request in my neurologic clinic today for initial consultation of her memory loss.  The patient is accompanied by her son today.  As you know, [name] is an 89 year old female with an underlying medical history of allergies, PVCs, coronary artery disease, with status post CABG, thyroid nodules, hypertension, and hyperlipidemia, who reports having forgetfulness, which she reports is normal for an 78 year-old.  Her son does not provide any specific information as to her memory issues, reports that he does not know why she was referred here. Of note, he talked to the nurse while patient was at blood work later on, reporting that he is more worried about psychiatric symptoms such as bipolar symptoms in his mom.  He wanted to talk to me separately from her outside the room but I discouraged this as it can cause patients to be upset as they feel left out.  Her son was encouraged to sign her up for MyChart and reach out to [place] by either phone call or messaging if he had any additional concerns or information.  As he recalls, there is no family history of dementia, patient reports that neither parent had memory loss, mom lived to be 68 per patient's son and patient's father lived to be 73.  She had 1 brother who passed away, no evidence of memory loss as far as they know.  The patient had a recent head CT scan but the report is not available, scan was done on 11/02/2022, they have only a copy on CD which I could not put in my laptop as it does not have a CD drive.  She is a non-smoker and does not drink alcohol, caffeine in the form of coffee, usually 2 cups in the morning.  She has not had any recent falls.  She has chronic swelling of her legs, had  fractured her right leg which is usually more swollen.  She lives with her husband [name], she has 1 son lives close by and a daughter who lives close to [place].  I reviewed your office note from 09/25/2022.  She had blood work at the time and I was able to review the results: TSH was 0.711, CBC with differential and platelets with benign results, CMP showed BUN of 27, creatinine 1.0, glucose 93, sodium 141, potassium 3.7, alk phos 50, AST 22, ALT 16, vitamin B12 was also checked but result was not available in the records.  Her Past Medical History Is Significant For: Past Medical History:  Diagnosis Date   Abnormal CXR 06/13/2019   Accelerated hypertension 03/15/2022   Acute bronchitis 03/15/2022   Angina at rest 03/15/2022   Anxiety 03/15/2022   Anxiety and depression 03/15/2022   Last Assessment & Plan:  Formatting of this note might be different from the original. - not on meds at home, clinically very anxious, noted received buspirone '5mg'$  x1 at midnight  - support provided   Boxers fracture 03/15/2022   CAD (coronary artery disease) of artery bypass graft 03/15/2022   Chest pain 06/06/2019   Last Assessment & Plan:  Formatting of this note might be different from the original. - intermittent chest pain over the past 1-2 weeks, currently pain free   -  will check 2nd trop, CK today - EKG non-specific, start telemetry monitor  - cardiology consult pending  - Echo pending   Coronary artery disease involving native coronary artery of native heart 11/17/2015   Overview:  s/p CABGSx5 in 2003Lexiscan MPS in Aug 2011 without ischemia, EF 65% Cardiolite 10/29/12: CONCLUSIONS:  Study quality: fair, with obvious inferior attenuation artifact reducing the sensitivity of the test  (1) diaphragm attenuation artifact - prior non-transmural infarction cannot be excluded  (2) normal LV size, with ? inferior hypokinesis - measured LVEF of 48% may be an underestimate  (3) no areas of (vasodilator) stress-induced  hypoperfusion are identified.  Negative perfusion stress test for potenital ischemia.   Cardiac catheterization January 2018 drug-eluting stent to graft going to the posterior descending artery   Cough 06/13/2019   Cyst of skin and subcutaneous tissue 10/12/2020   Dyspnea on exertion 03/15/2022   Essential hypertension 11/17/2015   GERD (gastroesophageal reflux disease) 03/15/2022   Hx of CABG 03/15/2022   Hyperlipemia 11/17/2015   Hyperlipidemia 03/15/2022   Left against medical advice 03/15/2022   Malignant neoplasm of left female breast ([place]) 01/23/2018   Neck mass 10/11/2020   Nontoxic multinodular goiter 08/14/2016   NSTEMI (non-ST elevated myocardial infarction) ([place]) 10/18/2016   Orthopnea 03/15/2022   Pancreatic cyst 03/15/2022   Paresthesia 11/27/2019   Posterior neck pain 01/23/2018   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 - cough, fatigue, weakness for 1-2 weeks - nonspecific symptoms, will r/o COVID-19   Thyroid mass 11/30/2020   Vitamin B 12 deficiency    Weakness 06/06/2019   Last Assessment & Plan:  Formatting of this note might be different from the original. - TSH WNL - B12 low, replacement as below  - r/o ACS, COVID-19, likely due to age and debility , PT to see    Her Past Surgical History Is Significant For: Past Surgical History:  Procedure Laterality Date   ABDOMINAL HYSTERECTOMY     CORONARY ARTERY BYPASS GRAFT     MASTECTOMY      Her Family History Is Significant For: Family History  Problem Relation Age of Onset   Heart disease Mother    Heart disease Father     Her Social History Is Significant For: Social History   Socioeconomic History   Marital status: Married    Spouse  name: Not on file   Number of children: Not on file   Years of education: Not on file   Highest education level: Not on file  Occupational History   Not on file  Tobacco Use   Smoking status: Never   Smokeless tobacco: Never  Vaping Use   Vaping Use: Never used  Substance and Sexual Activity   Alcohol use: No   Drug use: No   Sexual activity: Not on file  Other Topics Concern   Not on file  Social History Narrative   Not on file   Social Determinants of Health   Financial Resource Strain: Not on file  Food Insecurity: Not on file  Transportation Needs: Not on file  Physical Activity: Not on file  Stress: Not on file  Social Connections: Not on file    Her Allergies Are:  No Known Allergies:   Her  Current Medications Are:  Outpatient Encounter Medications as of 11/13/2022  Medication Sig   aspirin EC 81 MG tablet Take 81 mg by mouth daily.   cetirizine (ZYRTEC) 10 MG tablet Take 1 tablet by mouth daily.   clopidogrel (PLAVIX) 75 MG tablet Take 1 tablet (75 mg total) by mouth daily.   cyanocobalamin 1000 MCG tablet Take 1,000 mcg by mouth daily.   ipratropium (ATROVENT) 0.06 % nasal spray Use 2 sprays in each nostril twice daily   isosorbide mononitrate (IMDUR) 30 MG 24 hr tablet Take 1 tablet (30 mg total) by mouth daily.   losartan (COZAAR) 25 MG tablet Take 1 tablet (25 mg total) by mouth daily.   metoprolol tartrate (LOPRESSOR) 25 MG tablet Take 0.5 tablets (12.5 mg total) by mouth 2 (two) times daily.   Multiple Vitamins-Minerals (PRESERVISION AREDS 2 PO) Take 1 tablet by mouth daily.   nitroGLYCERIN (NITROSTAT) 0.4 MG SL tablet PLACE 1 TABLET UNDER THE TONGUE EVERY 5 MINUTES FOR UP TO 3 DOSES AS NEEDED FOR CHEST PAIN. CALL 911 IF PAIN PERSISTS.   rosuvastatin (CRESTOR) 40 MG tablet Take 1 tablet (40 mg total) by mouth daily.   traMADol (ULTRAM) 50 MG tablet Take by mouth every 6 (six) hours as needed.   triamcinolone (NASACORT ALLERGY 24HR) 55 MCG/ACT AERO nasal  inhaler Place 2 sprays into the nose daily.   No facility-administered encounter medications on file as of 11/13/2022.  :   Review of Systems:  Out of a complete 14 point review of systems, all are reviewed and negative with the exception of these symptoms as listed below:   Review of Systems  Neurological:        Rm 9.  NP Paper referral for Memory loss.  MMSE 26/30.  Objective:  Neurological Exam  Physical Exam Physical Examination:   Vitals:   11/13/22 1446  BP: (!) 160/98    General Examination: The patient is a very pleasant 89 y.o. female in no acute distress. She appears frail, well-groomed.    HEENT: Normocephalic, atraumatic, pupils are equal, round and reactive to light, status post cataract repairs, tracking fairly well-preserved, hearing is impaired, no hearing aids.   Speech is scant without dysarthria or hypophonia.   Oropharynx exam reveals: moderate mouth dryness, adequate dental hygiene.  Tongue protrudes centrally and palate elevates symmetrically.   Chest: Clear to auscultation without wheezing, rhonchi or crackles noted.  Heart: S1+S2+0, regular and normal without murmurs, rubs or gallops noted.   Abdomen: Soft, non-tender and non-distended.  Extremities: There is swelling in both lower extremities, right more than left, mild ankle deformity right leg from prior surgery, has hardware in place per son.   Skin: Warm and dry without trophic changes noted.   Musculoskeletal: exam reveals no obvious joint deformities except for right ankle deformity and arthritic changes in the hands.   Neurologically:  Mental status: The patient is awake, alert and pays attention but provides very little information.  Mood is restricted, affect blunted.       11/13/2022    2:54 PM  MMSE - Mini Mental State Exam  Orientation to time 3  Orientation to Place 4  Registration 3  Attention/ Calculation 5  Recall 2  Language- name 2 objects 2  Language- repeat 1   Language- follow 3 step command 3  Language- read & follow direction 1  Write a sentence 1  Copy design 1  Total score 26    Cranial nerves II - XII are  as described above under HEENT exam.  Motor exam: Normal bulk, strength and tone is noted. There is no obvious action or resting tremor.  Fine motor skills and coordination: grossly intact.  Cerebellar testing: No dysmetria or intention tremor. There is no truncal or gait ataxia.  Sensory exam: intact to light touch.  She walks without a walking aid.  Assessment and Plan:  In summary, HAVANNA GRONER is a very pleasant 89 y.o.-year old female with an underlying medical history of allergies, PVCs, coronary artery disease, with status post CABG, thyroid nodules, hypertension, and hyperlipidemia, who presents for evaluation of her memory loss, according to your records of several months duration with fluctuation in presentation.  She does have risk factors for vascular dementia, she had a recent head CT, we will ask your office for the report.  Please forward the report to our office, we will proceed with further workup in the form of a brain MRI.  She does appear to be hard of hearing and looking into hearing aids may help, as hearing properly aids in communication.  I would like to proceed with additional blood work today, the patient discussed this with her husband later on as well and her son and is agreeable to pursuing testing at this time.  We will continue to monitor her symptoms and examination and plan of follow-up after testing.  I did not suggest any new medications at this time.  We talked about the portance of healthy lifestyle, good hydration especially.  I answered all their questions today and the patient and her son were in agreement.   Thank you very much for allowing me to participate in the care of this nice patient. If I can be of any further assistance to you please do not hesitate to call me at  [phone].  Sincerely,   Star Age, MD, PhD

## 2022-11-13 NOTE — Patient Instructions (Addendum)
You have complaints of memory loss: memory loss or changes in cognitive function can have many reasons and does not always mean you have dementia. Conditions that can contribute to subjective or objective memory loss include: depression, stress, poor sleep from insomnia or sleep apnea, dehydration, fluctuation in blood sugar values, thyroid or electrolyte dysfunction and certain vitamin deficiencies. Dementia can be caused by stroke, brain atherosclerosis or brain vascular disease due to vascular risk factors (smoking, high blood pressure, high cholesterol, obesity and uncontrolled diabetes), certain degenerative brain disorders (including Parkinson's disease and Multiple sclerosis) and by Alzheimer's disease or other, more rare and sometimes hereditary causes. We will do some additional testing: blood work (which we will do today) and we will do a brain scan. We will not start medication as yet.   We will call you with your blood test and brain scan test results and monitor your symptoms.

## 2022-11-14 ENCOUNTER — Telehealth: Payer: Self-pay | Admitting: Neurology

## 2022-11-14 NOTE — Progress Notes (Deleted)
Cardiology Office Note:    Date:  11/14/2022   ID:  Christine Rich, DOB 12-25-1933, MRN [id number]  PCP:  Street, Christine Mt, MD  Cardiologist:  Shirlee More, MD    Referring MD: [id number], Christine Rich, *    ASSESSMENT:    No diagnosis found. PLAN:    In order of problems listed above:  ***   Next appointment: ***   Medication Adjustments/Labs and Tests Ordered: Current medicines are reviewed at length with the patient today.  Concerns regarding medicines are outlined above.  No orders of the defined types were placed in this encounter.  No orders of the defined types were placed in this encounter.   No chief complaint on file.   History of Present Illness:    Christine Rich is a 89 y.o. female with a hx of CAD s/p CABG 2013 and NSTEMI 10/17/16 with PCi and DES to SVG to PDA EF 55-60%, DLD, HTN last seen 05/08/2022.  She was seen at [facility] ED 11/02/2018 for an episode described as below with weakness and orthostatic symptoms.  In the emergency room her blood pressure is 157/87 does not appear as if she had orthostatic signs performed hemoglobin was 14.6 platelets mildly reduced 129,000 potassium 3.5 creatinine 0.9 along with all the other test showed troponin undetectable and a proBNP level mildly elevated at 1150 without clinical congestive heart.  Her EKG showed sinus rhythm right bundle branch block no ischemic changes.  She was discharged from the emergency room with a diagnosis of weakness Compliance with diet, lifestyle and medications: *** Past Medical History:  Diagnosis Date   Abnormal CXR 06/13/2019   Accelerated hypertension 03/15/2022   Acute bronchitis 03/15/2022   Angina at rest 03/15/2022   Anxiety 03/15/2022   Anxiety and depression 03/15/2022   Last Assessment & Plan:  Formatting of this note might be different from the original. - not on meds at home, clinically very anxious, noted received buspirone '5mg'$  x1 at midnight  - support provided   Boxers  fracture 03/15/2022   CAD (coronary artery disease) of artery bypass graft 03/15/2022   Chest pain 06/06/2019   Last Assessment & Plan:  Formatting of this note might be different from the original. - intermittent chest pain over the past 1-2 weeks, currently pain free   - will check 2nd trop, CK today - EKG non-specific, start telemetry monitor  - cardiology consult pending  - Echo pending   Coronary artery disease involving native coronary artery of native heart 11/17/2015   Overview:  s/p CABGSx5 in 2003Lexiscan MPS in Aug 2011 without ischemia, EF 65% Cardiolite 10/29/12: CONCLUSIONS:  Study quality: fair, with obvious inferior attenuation artifact reducing the sensitivity of the test  (1) diaphragm attenuation artifact - prior non-transmural infarction cannot be excluded  (2) normal LV size, with ? inferior hypokinesis - measured LVEF of 48% may be an underestimate  (3) no areas of (vasodilator) stress-induced hypoperfusion are identified.  Negative perfusion stress test for potenital ischemia.   Cardiac catheterization January 2018 drug-eluting stent to graft going to the posterior descending artery   Cough 06/13/2019   Cyst of skin and subcutaneous tissue 10/12/2020   Dyspnea on exertion 03/15/2022   Essential hypertension 11/17/2015   GERD (gastroesophageal reflux disease) 03/15/2022   Hx of CABG 03/15/2022   Hyperlipemia 11/17/2015   Hyperlipidemia 03/15/2022   Left against medical advice 03/15/2022   Malignant neoplasm of left female breast ([place]) 01/23/2018   Neck mass 10/11/2020  Nontoxic multinodular goiter 08/14/2016   NSTEMI (non-ST elevated myocardial infarction) ([place]) 10/18/2016   Orthopnea 03/15/2022   Pancreatic cyst 03/15/2022   Paresthesia 11/27/2019   Posterior neck pain 01/23/2018   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 - cough, fatigue, weakness for 1-2 weeks - nonspecific symptoms, will r/o COVID-19   Thyroid mass 11/30/2020   Vitamin B 12 deficiency    Weakness 06/06/2019   Last Assessment & Plan:  Formatting of this note might be different from the original. - TSH WNL - B12 low, replacement as below  - r/o ACS, COVID-19, likely due to age and debility , PT to see    Past Surgical History:  Procedure Laterality Date   ABDOMINAL HYSTERECTOMY     CORONARY ARTERY BYPASS GRAFT     MASTECTOMY      Current Medications: No outpatient medications have been marked as taking for the 11/15/22 encounter (Appointment) with Richardo Priest, MD.     Allergies:   Patient has no known allergies.   Social History   Socioeconomic History   Marital status: Married    Spouse name: Not on file   Number of children: Not on file   Years of education: Not on file   Highest education level: Not on file  Occupational History   Not on file  Tobacco Use   Smoking status: Never   Smokeless tobacco: Never  Vaping Use   Vaping Use: Never used  Substance and Sexual Activity   Alcohol use: No   Drug use: No   Sexual activity: Not on file  Other Topics Concern   Not on file  Social History Narrative   Not on file   Social Determinants of Health   Financial Resource Strain: Not on file  Food Insecurity: Not on file  Transportation Needs: Not on file  Physical Activity: Not on file  Stress: Not on file  Social Connections: Not on file     Family History: The patient's ***family history includes Heart disease in her father and mother. ROS:   Please see the history of present illness.    All other systems reviewed and are negative.  EKGs/Labs/Other Studies Reviewed:    The following studies were reviewed today:  EKG:  EKG ordered today and personally reviewed.  The ekg ordered today  demonstrates ***  Recent Labs: 03/17/2022: ALT 16; BUN 14; Creatinine, Ser 0.92; Potassium 4.2; Sodium 143  Recent Lipid Panel    Component Value Date/Time   CHOL 214 (H) 03/17/2022 1109   TRIG 83 03/17/2022 1109   HDL 50 03/17/2022 1109   CHOLHDL 4.3 03/17/2022 1109   LDLCALC 149 (H) 03/17/2022 1109    Physical Exam:    VS:  There were no vitals taken for this visit.    Wt Readings from Last 3 Encounters:  11/13/22 105 lb (47.6 kg)  09/18/22 110 lb (49.9 kg)  05/08/22 110 lb (49.9 kg)     GEN: *** Well nourished, well developed in no acute distress HEENT: Normal NECK: No JVD; No carotid bruits LYMPHATICS: No lymphadenopathy CARDIAC: ***RRR, no murmurs, rubs, gallops RESPIRATORY:  Clear to auscultation without rales, wheezing or rhonchi  ABDOMEN: Soft, non-tender, non-distended MUSCULOSKELETAL:  No edema; No deformity  SKIN: Warm and dry NEUROLOGIC:  Alert and oriented x 3 PSYCHIATRIC:  Normal affect    Signed, Shirlee More, MD  11/14/2022 5:22 PM    [facility]

## 2022-11-14 NOTE — Telephone Encounter (Signed)
Christine Rich Christine Rich: [phone] exp. 11/14/22-12/14/22 sent to GI [phone]

## 2022-11-15 ENCOUNTER — Ambulatory Visit: Payer: Medicare PPO | Attending: Cardiology | Admitting: Cardiology

## 2022-11-16 ENCOUNTER — Encounter: Payer: Self-pay | Admitting: Cardiology

## 2022-11-21 ENCOUNTER — Telehealth: Payer: Self-pay

## 2022-11-21 LAB — ANA W/REFLEX: Anti Nuclear Antibody (ANA): NEGATIVE

## 2022-11-21 LAB — VITAMIN B6: Vitamin B6: 11.6 ug/L (ref 3.4–65.2)

## 2022-11-21 LAB — B12 AND FOLATE PANEL
Folate: 6.1 ng/mL (ref >3.0)
Vitamin B-12: 585 pg/mL (ref 232–1245)

## 2022-11-21 LAB — RPR: RPR Ser Ql: NONREACTIVE

## 2022-11-21 LAB — VITAMIN D 25 HYDROXY (VIT D DEFICIENCY, FRACTURES): Vit D, 25-Hydroxy: 25.1 ng/mL — ABNORMAL LOW (ref 30.0–100.0)

## 2022-11-21 LAB — HGB A1C W/O EAG: Hgb A1c MFr Bld: 5.5 % (ref 4.8–5.6)

## 2022-11-21 LAB — SEDIMENTATION RATE: Sed Rate: 2 mm/hr (ref 0–40)

## 2022-11-21 LAB — VITAMIN B1: Thiamine: 103.7 nmol/L (ref 66.5–200.0)

## 2022-11-21 NOTE — Telephone Encounter (Signed)
-----   Message from Star Age, MD sent at 11/20/2022  2:05 PM EST ----- Please call patient's family member on DPR with regards to her blood work, vitamin D level was mildly below normal at 25.1, normal is 30-100.  I recommend over-the-counter vitamin D supplementation about 500 to 1000 units daily for now.  She can discuss further with her primary care and have it rechecked with her primary care in about 3 to 6 months after supplementing.  Other test results were benign, 1 test result is pending which is the vitamin B1 level, typically takes a few more days to come back, we will update them if abnormal.

## 2022-11-21 NOTE — Telephone Encounter (Signed)
I called patient to discuss. Home number keeps ringing and no answer no VM.

## 2022-11-22 NOTE — Telephone Encounter (Signed)
I called patient again to discuss. No answer, phone keeps ringing. Unable to leave VM.

## 2022-11-23 ENCOUNTER — Encounter: Payer: Self-pay | Admitting: *Deleted

## 2022-11-23 NOTE — Telephone Encounter (Signed)
I tried to reach the husband again. The patient answered and stated he was away at work today. He may be home sometime this afternoon. Patient took notes that vitamin D level slightly below normal, recommend supplementation with OTC Vitamin D [phone] units daily for now, recheck with PCP in 3-6 months after supplementation. No further concerns on labs. I will send a letter to her home and try to reach husband this afternoon. He is the only one on her DPR.   I mailed a letter with results and recommendations to pt's home. I also sent the results to pt's PCP on file, Dr Christa See.

## 2022-11-23 NOTE — Telephone Encounter (Signed)
Tried to reach Mr Kawashima (husband), rang multiple times then vm said memory was full.

## 2022-11-28 ENCOUNTER — Inpatient Hospital Stay: Admission: RE | Admit: 2022-11-28 | Payer: Medicare PPO | Source: Ambulatory Visit

## 2022-12-20 DIAGNOSIS — Z111 Encounter for screening for respiratory tuberculosis: Secondary | ICD-10-CM | POA: Diagnosis not present

## 2022-12-20 NOTE — Progress Notes (Unsigned)
Cardiology Office Note:    Date:  12/21/2022   ID:  SENNIE KINN, DOB 1933/10/30, MRN [id number]  PCP:  Street, Sharon Mt, MD  Cardiologist:  Shirlee More, MD    Referring MD: [address] Sharon Mt, *    ASSESSMENT:    1. Coronary artery disease of native artery of native heart with stable angina pectoris ([place])   2. Essential hypertension   3. Mixed hyperlipidemia   4. PVCs (premature ventricular contractions)    PLAN:    In order of problems listed above:  Taking very careful and cautious conservative approach this is Babicz is doing well with the medical therapy including dual antiplatelet oral nitrate beta-blocker and lipid-lowering with statin. I have asked her son to take over management medications and she has agreed Hypertension is well-controlled continue current treatment with low-dose ARB Continue statin we will check a lipid profile today Continue beta-blocker with PVCs   Next appointment: 6 months   Medication Adjustments/Labs and Tests Ordered: Current medicines are reviewed at length with the patient today.  Concerns regarding medicines are outlined above.  No orders of the defined types were placed in this encounter.  No orders of the defined types were placed in this encounter.   Chief Complaint  Patient presents with   Follow-up   Coronary Artery Disease    History of Present Illness:    CIAIRA KOSHIOL is a 89 y.o. female with a hx of CAD with CABG 2013 non-ST elevation MI 10/17/2017 with PCI and drug-eluting stent to the vein graft to the posterior descending artery normal ejection fraction dyslipidemia hypertension and frequent PVCs last seen 05/08/2022.  Compliance with diet, lifestyle and medications: Yes  Her son Shanon Brow is present he really does not nice job managing his patient's will kick back about medications and she has agreed to let him send her medicines pillbox she tolerates her dual antiplatelet without GI side effects or  lipid-lowering with statin without muscle pain and she has had no anginal discomfort. Past Medical History:  Diagnosis Date   Abnormal CXR 06/13/2019   Accelerated hypertension 03/15/2022   Acute bronchitis 03/15/2022   Angina at rest 03/15/2022   Anxiety 03/15/2022   Anxiety and depression 03/15/2022   Last Assessment & Plan:  Formatting of this note might be different from the original. - not on meds at home, clinically very anxious, noted received buspirone '5mg'$  x1 at midnight  - support provided   Boxers fracture 03/15/2022   CAD (coronary artery disease) of artery bypass graft 03/15/2022   Chest pain 06/06/2019   Last Assessment & Plan:  Formatting of this note might be different from the original. - intermittent chest pain over the past 1-2 weeks, currently pain free   - will check 2nd trop, CK today - EKG non-specific, start telemetry monitor  - cardiology consult pending  - Echo pending   Coronary artery disease involving native coronary artery of native heart 11/17/2015   Overview:  s/p CABGSx5 in 2003Lexiscan MPS in Aug 2011 without ischemia, EF 65% Cardiolite 10/29/12: CONCLUSIONS:  Study quality: fair, with obvious inferior attenuation artifact reducing the sensitivity of the test  (1) diaphragm attenuation artifact - prior non-transmural infarction cannot be excluded  (2) normal LV size, with ? inferior hypokinesis - measured LVEF of 48% may be an underestimate  (3) no areas of (vasodilator) stress-induced hypoperfusion are identified.  Negative perfusion stress test for potenital ischemia.   Cardiac catheterization January 2018 drug-eluting stent to graft going  to the posterior descending artery   Cough 06/13/2019   Cyst of skin and subcutaneous tissue 10/12/2020   Dyspnea on exertion 03/15/2022   Essential hypertension 11/17/2015   GERD (gastroesophageal reflux disease) 03/15/2022   Hx of CABG 03/15/2022   Hyperlipemia 11/17/2015   Hyperlipidemia 03/15/2022   Left against medical advice 03/15/2022    Malignant neoplasm of left female breast ([place]) 01/23/2018   Neck mass 10/11/2020   Nontoxic multinodular goiter 08/14/2016   NSTEMI (non-ST elevated myocardial infarction) ([place]) 10/18/2016   Orthopnea 03/15/2022   Pancreatic cyst 03/15/2022   Paresthesia 11/27/2019   Posterior neck pain 01/23/2018   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 - cough, fatigue, weakness for 1-2 weeks - nonspecific symptoms, will r/o COVID-19   Thyroid mass 11/30/2020   Vitamin B 12 deficiency    Weakness 06/06/2019   Last Assessment & Plan:  Formatting of this note might be different from the original. - TSH WNL - B12 low, replacement as below  - r/o ACS, COVID-19, likely due to age and debility , PT to see    Past Surgical History:  Procedure Laterality Date   ABDOMINAL HYSTERECTOMY     CORONARY ARTERY BYPASS GRAFT     MASTECTOMY      Current Medications: Current Meds  Medication Sig   aspirin EC 81 MG tablet Take 81 mg by mouth daily.   cetirizine (ZYRTEC) 10 MG tablet Take 1 tablet by mouth daily.   clopidogrel (PLAVIX) 75 MG tablet Take 1 tablet (75 mg total) by mouth daily.   cyanocobalamin 1000 MCG tablet Take 1,000 mcg by mouth daily.   ipratropium (ATROVENT) 0.06 % nasal spray Use 2 sprays in each nostril twice daily   isosorbide mononitrate (IMDUR) 30 MG 24 hr tablet Take 1 tablet (30 mg total) by mouth daily.   losartan (COZAAR) 25 MG tablet Take 1 tablet (25 mg total) by mouth daily.   metoprolol tartrate (LOPRESSOR) 25 MG tablet Take 0.5 tablets (12.5 mg total) by mouth 2 (two) times daily.   Multiple Vitamins-Minerals (PRESERVISION AREDS 2 PO) Take 1 tablet by mouth daily.   nitroGLYCERIN (NITROSTAT) 0.4 MG SL  tablet PLACE 1 TABLET UNDER THE TONGUE EVERY 5 MINUTES FOR UP TO 3 DOSES AS NEEDED FOR CHEST PAIN. CALL 911 IF PAIN PERSISTS.   rosuvastatin (CRESTOR) 40 MG tablet Take 1 tablet (40 mg total) by mouth daily.   traMADol (ULTRAM) 50 MG tablet Take by mouth every 6 (six) hours as needed.   triamcinolone (NASACORT ALLERGY 24HR) 55 MCG/ACT AERO nasal inhaler Place 2 sprays into the nose daily.     Allergies:   Patient has no known allergies.   Social History   Socioeconomic History   Marital status: Married    Spouse name: Not on file   Number of children: Not on file   Years of education: Not on file   Highest education level: Not on file  Occupational History   Not on file  Tobacco Use   Smoking status: Never   Smokeless tobacco: Never  Vaping Use   Vaping Use: Never used  Substance and Sexual Activity   Alcohol use: No   Drug use: No   Sexual activity: Not on file  Other Topics Concern   Not on file  Social History Narrative   Not on file   Social Determinants of Health   Financial Resource Strain: Not on file  Food Insecurity: Not on file  Transportation Needs: Not on file  Physical Activity: Not on file  Stress: Not on file  Social Connections: Not on file     Family History: The patient's family history includes Heart disease in her father and mother. ROS:   Please see the history of present illness.    All other systems reviewed and are negative.  EKGs/Labs/Other Studies Reviewed:    The following studies were reviewed today:  Cardiac Studies & Procedures     STRESS TESTS  MYOCARDIAL PERFUSION IMAGING 07/15/2019  Narrative  The left ventricular ejection fraction is hyperdynamic (>65%).  Nuclear stress EF: 70%.  There was no ST segment deviation noted during stress.  Defect 1: There is a medium defect of moderate severity present in the basal inferoseptal, basal inferior, mid inferoseptal and mid inferior location. This defect is reversible.   Findings consistent with ischemia.  This is a high risk study.     MONITORS  LONG TERM MONITOR (3-14 DAYS) 06/08/2019  Narrative A ZIO monitor was performed 6 days 23 hours beginning 05/20/2019 to assess symptomatic PVCs.  The rhythm throughout was sinus with minimum average and maximum heart rates of 44, 69 and 117 bpm.  The minimum rate was sinus bradycardia.  There was 1 single triggered event with frequent PVCs.  There were no pauses of 3 seconds or greater and no episodes of sinus node or AV nodal block.  Ventricular arrhythmia was occasional PVCs rare 181 couplets and rare triplets 21.  There was 1 episode of nonsustained ventricular tachycardia 9 complexes in a relatively slow rate of 139 bpm.  Supraventricular arrhythmia was rare with APCs.  There are no episodes of atrial fibrillation or flutter.  18 brief episodes of atrial tachycardia were present the longest 14 complexes at a rate of 96 bpm and the fastest 4 complexes at a rate of 148 bpm.   Conclusion overall  occasional ventricular and rare supraventricular arrhythmia with a single triggered event showing frequent PVCs and one 9 beat run of nonsustained ventricular tachycardia.            Recent Labs: 03/17/2022: ALT 16; BUN 14; Creatinine, Ser 0.92; Potassium 4.2; Sodium 143  Recent Lipid Panel    Component Value Date/Time   CHOL 214 (H) 03/17/2022 1109   TRIG 83 03/17/2022 1109   HDL 50 03/17/2022 1109   CHOLHDL 4.3 03/17/2022 1109   LDLCALC 149 (H) 03/17/2022 1109    Physical Exam:    VS:  BP 110/68 (BP Location: Right Arm, Patient Position: Sitting, Cuff Size: Small)   Pulse 60   Ht '5\' 2"'$  (1.575 m)   Wt 112 lb 3.2 oz (50.9 kg)   SpO2 96%   BMI 20.52 kg/m     Wt Readings from Last 3 Encounters:  12/21/22 112 lb 3.2 oz (50.9 kg)  11/13/22 105 lb (47.6 kg)  09/18/22 110 lb (49.9 kg)     GEN:  Well nourished, well developed in no acute distress HEENT: Normal NECK: No JVD; No carotid  bruits LYMPHATICS: No lymphadenopathy CARDIAC: RRR, no murmurs, rubs, gallops RESPIRATORY:  Clear to auscultation without rales, wheezing or rhonchi  ABDOMEN: Soft, non-tender, non-distended MUSCULOSKELETAL:  No edema; No deformity  SKIN: Warm and dry NEUROLOGIC:  Alert and oriented x 3 PSYCHIATRIC:  Normal affect    Signed, Shirlee More, MD  12/21/2022 3:12 PM    [place]

## 2022-12-21 ENCOUNTER — Encounter: Payer: Self-pay | Admitting: Cardiology

## 2022-12-21 ENCOUNTER — Ambulatory Visit: Payer: Medicare PPO | Attending: Cardiology | Admitting: Cardiology

## 2022-12-21 VITALS — BP 110/68 | HR 60 | Ht 62.0 in | Wt 112.2 lb

## 2022-12-21 DIAGNOSIS — I25118 Atherosclerotic heart disease of native coronary artery with other forms of angina pectoris: Secondary | ICD-10-CM

## 2022-12-21 DIAGNOSIS — E782 Mixed hyperlipidemia: Secondary | ICD-10-CM | POA: Diagnosis not present

## 2022-12-21 DIAGNOSIS — I10 Essential (primary) hypertension: Secondary | ICD-10-CM | POA: Diagnosis not present

## 2022-12-21 DIAGNOSIS — I493 Ventricular premature depolarization: Secondary | ICD-10-CM

## 2022-12-21 NOTE — Patient Instructions (Signed)
Medication Instructions:  Your physician recommends that you continue on your current medications as directed. Please refer to the Current Medication list given to you today.  *If you need a refill on your cardiac medications before your next appointment, please call your pharmacy*   Lab Work: Your physician recommends that you return for lab work in:   Labs today: CMP, Lipids  If you have labs (blood work) drawn today and your tests are completely normal, you will receive your results only by: [place] (if you have [place]) OR A paper copy in the mail If you have any lab test that is abnormal or we need to change your treatment, we will call you to review the results.   Testing/Procedures: None   Follow-Up: At [facility], you and your health needs are our priority.  As part of our continuing mission to provide you with exceptional heart care, we have created designated Provider Care Teams.  These Care Teams include your primary Cardiologist (physician) and Advanced Practice Providers (APPs -  Physician Assistants and Nurse Practitioners) who all work together to provide you with the care you need, when you need it.  We recommend signing up for the patient portal called "MyChart".  Sign up information is provided on this After Visit Summary.  MyChart is used to connect with patients for Virtual Visits (Telemedicine).  Patients are able to view lab/test results, encounter notes, upcoming appointments, etc.  Non-urgent messages can be sent to your provider as well.   To learn more about what you can do with MyChart, go to [email or web address].    Your next appointment:   6 month(s)  Provider:   Venia Carbon, NP [facility])    Other Instructions None

## 2022-12-22 LAB — COMPREHENSIVE METABOLIC PANEL
ALT: 15 IU/L (ref 0–32)
AST: 21 IU/L (ref 0–40)
Albumin/Globulin Ratio: 1.9 (ref 1.2–2.2)
Albumin: 4.1 g/dL (ref 3.7–4.7)
Alkaline Phosphatase: 79 IU/L (ref 44–121)
BUN/Creatinine Ratio: 25 (ref 12–28)
BUN: 25 mg/dL (ref 8–27)
Bilirubin Total: 0.3 mg/dL (ref 0.0–1.2)
CO2: 21 mmol/L (ref 20–29)
Calcium: 9.3 mg/dL (ref 8.7–10.3)
Chloride: 107 mmol/L — ABNORMAL HIGH (ref 96–106)
Creatinine, Ser: 1.02 mg/dL — ABNORMAL HIGH (ref 0.57–1.00)
Globulin, Total: 2.2 g/dL (ref 1.5–4.5)
Glucose: 110 mg/dL — ABNORMAL HIGH (ref 70–99)
Potassium: 4.5 mmol/L (ref 3.5–5.2)
Sodium: 143 mmol/L (ref 134–144)
Total Protein: 6.3 g/dL (ref 6.0–8.5)
eGFR: 54 mL/min/{1.73_m2} — ABNORMAL LOW (ref >59)

## 2022-12-22 LAB — LIPID PANEL
Chol/HDL Ratio: 3.6 ratio (ref 0.0–4.4)
Cholesterol, Total: 165 mg/dL (ref 100–199)
HDL: 46 mg/dL (ref >39)
LDL Chol Calc (NIH): 92 mg/dL (ref 0–99)
Triglycerides: 158 mg/dL — ABNORMAL HIGH (ref 0–149)
VLDL Cholesterol Cal: 27 mg/dL (ref 5–40)

## 2023-01-14 NOTE — Progress Notes (Signed)
Cardiology Office Note:    Date:  01/15/2023   ID:  Christine Rich, DOB [birth year], MRN [id number]  PCP:  Street, Sharon Mt, MD  Cardiologist:  Shirlee More, MD    Referring MD: [id number], Sharon Mt, *    ASSESSMENT:    1. Localized swelling of both lower extremities   2. Coronary artery disease of native artery of native heart with stable angina pectoris   3. Essential hypertension   4. Mixed hyperlipidemia    PLAN:    In order of problems listed above:  Clinically I think the edema is a side effect of Remeron Reluctant to give her medication to treat a side effect Will contact me if there is worsening (low-density diuretic intermittently Stable CAD hypertension hyperlipidemia no change in treatment   Next appointment: 3 months   Medication Adjustments/Labs and Tests Ordered: Current medicines are reviewed at length with the patient today.  Concerns regarding medicines are outlined above.  No orders of the defined types were placed in this encounter.  No orders of the defined types were placed in this encounter.   Chief complaint my mother had edema the staff said she needed to see a physician   History of Present Illness:    Christine Rich is a 89 y.o. female with a hx of CAD with CABG 2013 non-ST elevation MI 10/17/2017 with PCI and drug-eluting stent to the vein graft to the posterior descending artery dyslipidemia hypertension and frequent PVCs last seen 3 weeks previous 12/21/2022.  Compliance with diet, lifestyle and medications: Yes  She has been in a dementia unit for 3 weeks she is very unhappy she was placed on Remeron and has developed lower extremity edema not uncommon with this class of medication She is not short of breath not having chest pain palpitation or syncope Her son is present and we made decision just for watchful waiting it is worsened and bothersome will be given a low-dose of a diuretic.   Past Medical History:  Diagnosis Date    Abnormal CXR 06/13/2019   Accelerated hypertension 03/15/2022   Acute bronchitis 03/15/2022   Angina at rest 03/15/2022   Anxiety 03/15/2022   Anxiety and depression 03/15/2022   Last Assessment & Plan:  Formatting of this note might be different from the original. - not on meds at home, clinically very anxious, noted received buspirone 5mg  x1 at midnight  - support provided   Boxers fracture 03/15/2022   CAD (coronary artery disease) of artery bypass graft 03/15/2022   Chest pain 06/06/2019   Last Assessment & Plan:  Formatting of this note might be different from the original. - intermittent chest pain over the past 1-2 weeks, currently pain free   - will check 2nd trop, CK today - EKG non-specific, start telemetry monitor  - cardiology consult pending  - Echo pending   Coronary artery disease involving native coronary artery of native heart 11/17/2015   Overview:  s/p CABGSx5 in 2003Lexiscan MPS in Aug 2011 without ischemia, EF 65% Cardiolite 10/29/12: CONCLUSIONS:  Study quality: fair, with obvious inferior attenuation artifact reducing the sensitivity of the test  (1) diaphragm attenuation artifact - prior non-transmural infarction cannot be excluded  (2) normal LV size, with ? inferior hypokinesis - measured LVEF of 48% may be an underestimate  (3) no areas of (vasodilator) stress-induced hypoperfusion are identified.  Negative perfusion stress test for potenital ischemia.   Cardiac catheterization January 2018 drug-eluting stent to graft going to the posterior descending  artery   Cough 06/13/2019   Cyst of skin and subcutaneous tissue 10/12/2020   Dyspnea on exertion 03/15/2022   Essential hypertension 11/17/2015   GERD (gastroesophageal reflux disease) 03/15/2022   Hx of CABG 03/15/2022   Hyperlipemia 11/17/2015   Hyperlipidemia 03/15/2022   Left against medical advice 03/15/2022   Malignant neoplasm of left female breast 01/23/2018   Neck mass 10/11/2020   Nontoxic multinodular goiter 08/14/2016   NSTEMI  (non-ST elevated myocardial infarction) 10/18/2016   Orthopnea 03/15/2022   Pancreatic cyst 03/15/2022   Paresthesia 11/27/2019   Posterior neck pain 01/23/2018   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 - cough, fatigue, weakness for 1-2 weeks - nonspecific symptoms, will r/o COVID-19   Thyroid mass 11/30/2020   Vitamin B 12 deficiency    Weakness 06/06/2019   Last Assessment & Plan:  Formatting of this note might be different from the original. - TSH WNL - B12 low, replacement as below  - r/o ACS, COVID-19, likely due to age and debility , PT to see    Past Surgical History:  Procedure Laterality Date   ABDOMINAL HYSTERECTOMY     CORONARY ARTERY BYPASS GRAFT     MASTECTOMY      Current Medications: Current Meds  Medication Sig   ALPRAZolam (XANAX) 0.25 MG tablet Take 0.25 mg by mouth at bedtime as needed for anxiety or sleep.   aspirin EC 81 MG tablet Take 81 mg by mouth daily.   cetirizine (ZYRTEC) 10 MG tablet Take 1 tablet by mouth daily.   clopidogrel (PLAVIX) 75 MG tablet Take 1 tablet (75 mg total) by mouth daily.   isosorbide mononitrate (IMDUR) 30 MG 24 hr tablet Take 1 tablet (30 mg total) by mouth daily.   losartan (COZAAR) 25 MG tablet Take 1 tablet (25 mg total) by mouth daily.   metoprolol succinate (TOPROL-XL) 25 MG 24 hr tablet Take 25 mg by mouth daily.   mirtazapine (REMERON) 7.5 MG tablet Take 7.5 mg by mouth at bedtime.     Allergies:   Patient has no known allergies.   Social History   Socioeconomic History   Marital status: Married    Spouse name: Not on file   Number of children: Not on file   Years of education: Not on file   Highest education level: Not on file  Occupational  History   Not on file  Tobacco Use   Smoking status: Never   Smokeless tobacco: Never  Vaping Use   Vaping Use: Never used  Substance and Sexual Activity   Alcohol use: No   Drug use: No   Sexual activity: Not on file  Other Topics Concern   Not on file  Social History Narrative   Not on file   Social Determinants of Health   Financial Resource Strain: Not on file  Food Insecurity: Not on file  Transportation Needs: Not on file  Physical Activity: Not on file  Stress: Not on file  Social Connections: Not on file     Family History: The patient's family history includes Heart disease in her father and mother. ROS:   Please see the history of present illness.    All other systems reviewed and are negative.  EKGs/Labs/Other Studies  Reviewed:    The following studies were reviewed today:  Cardiac Studies & Procedures     STRESS TESTS  MYOCARDIAL PERFUSION IMAGING 07/15/2019  Narrative  The left ventricular ejection fraction is hyperdynamic (>65%).  Nuclear stress EF: 70%.  There was no ST segment deviation noted during stress.  Defect 1: There is a medium defect of moderate severity present in the basal inferoseptal, basal inferior, mid inferoseptal and mid inferior location. This defect is reversible.  Findings consistent with ischemia.  This is a high risk study.     MONITORS  LONG TERM MONITOR (3-14 DAYS) 06/08/2019  Narrative A ZIO monitor was performed 6 days 23 hours beginning 05/20/2019 to assess symptomatic PVCs.  The rhythm throughout was sinus with minimum average and maximum heart rates of 44, 69 and 117 bpm.  The minimum rate was sinus bradycardia.  There was 1 single triggered event with frequent PVCs.  There were no pauses of 3 seconds or greater and no episodes of sinus node or AV nodal block.  Ventricular arrhythmia was occasional PVCs rare 181 couplets and rare triplets 21.  There was 1 episode of nonsustained ventricular tachycardia 9  complexes in a relatively slow rate of 139 bpm.  Supraventricular arrhythmia was rare with APCs.  There are no episodes of atrial fibrillation or flutter.  18 brief episodes of atrial tachycardia were present the longest 14 complexes at a rate of 96 bpm and the fastest 4 complexes at a rate of 148 bpm.   Conclusion overall  occasional ventricular and rare supraventricular arrhythmia with a single triggered event showing frequent PVCs and one 9 beat run of nonsustained ventricular tachycardia.             Recent Labs: 12/21/2022: ALT 15; BUN 25; Creatinine, Ser 1.02; Potassium 4.5; Sodium 143  Recent Lipid Panel    Component Value Date/Time   CHOL 165 12/21/2022 1536   TRIG 158 (H) 12/21/2022 1536   HDL 46 12/21/2022 1536   CHOLHDL 3.6 12/21/2022 1536   LDLCALC 92 12/21/2022 1536    Physical Exam:    VS:  BP (!) 148/64 (BP Location: Right Arm, Patient Position: Sitting)   Pulse 67   Ht 5\' 2"  (1.575 m)   Wt 112 lb (50.8 kg)   SpO2 95%   BMI 20.49 kg/m     Wt Readings from Last 3 Encounters:  01/15/23 112 lb (50.8 kg)  12/21/22 112 lb 3.2 oz (50.9 kg)  11/13/22 105 lb (47.6 kg)     GEN:  Well nourished, well developed in no acute distress HEENT: Normal NECK: MildJVD; No carotid bruits LYMPHATICS: No lymphadenopathy CARDIAC: RRR, no murmurs, rubs, gallops RESPIRATORY:  Clear to auscultation without rales, wheezing or rhonchi  ABDOMEN: Soft, non-tender, non-distended MUSCULOSKELETAL: She has 1+ bilateral lower extremity pitting edema; No deformity  SKIN: Warm and dry NEUROLOGIC:  Alert and oriented x 3 PSYCHIATRIC:  Normal affect    Signed, Shirlee More, MD  01/15/2023 9:33 AM    [place]

## 2023-01-15 ENCOUNTER — Ambulatory Visit: Payer: Medicare PPO | Attending: Cardiology | Admitting: Cardiology

## 2023-01-15 ENCOUNTER — Encounter: Payer: Self-pay | Admitting: Cardiology

## 2023-01-15 VITALS — BP 148/64 | HR 67 | Ht 62.0 in | Wt 112.0 lb

## 2023-01-15 DIAGNOSIS — I25118 Atherosclerotic heart disease of native coronary artery with other forms of angina pectoris: Secondary | ICD-10-CM | POA: Diagnosis not present

## 2023-01-15 DIAGNOSIS — E782 Mixed hyperlipidemia: Secondary | ICD-10-CM

## 2023-01-15 DIAGNOSIS — I10 Essential (primary) hypertension: Secondary | ICD-10-CM

## 2023-01-15 DIAGNOSIS — M7989 Other specified soft tissue disorders: Secondary | ICD-10-CM | POA: Diagnosis not present

## 2023-01-15 NOTE — Patient Instructions (Signed)
Medication Instructions:  Your physician recommends that you continue on your current medications as directed. Please refer to the Current Medication list given to you today.  *If you need a refill on your cardiac medications before your next appointment, please call your pharmacy*   Lab Work: None If you have labs (blood work) drawn today and your tests are completely normal, you will receive your results only by: MyChart Message (if you have MyChart) OR A paper copy in the mail If you have any lab test that is abnormal or we need to change your treatment, we will call you to review the results.   Testing/Procedures: None   Follow-Up: At [facility], you and your health needs are our priority.  As part of our continuing mission to provide you with exceptional heart care, we have created designated Provider Care Teams.  These Care Teams include your primary Cardiologist (physician) and Advanced Practice Providers (APPs -  Physician Assistants and Nurse Practitioners) who all work together to provide you with the care you need, when you need it.  We recommend signing up for the patient portal called "MyChart".  Sign up information is provided on this After Visit Summary.  MyChart is used to connect with patients for Virtual Visits (Telemedicine).  Patients are able to view lab/test results, encounter notes, upcoming appointments, etc.  Non-urgent messages can be sent to your provider as well.   To learn more about what you can do with MyChart, go to [email or web address].    Your next appointment:   3 month(s)  Provider:   Jennifer Woody, NP  Other Instructions None  

## 2023-01-17 ENCOUNTER — Other Ambulatory Visit: Payer: Self-pay | Admitting: Internal Medicine

## 2023-01-19 ENCOUNTER — Other Ambulatory Visit: Payer: Self-pay | Admitting: Internal Medicine

## 2023-01-19 DIAGNOSIS — S90424A Blister (nonthermal), right lesser toe(s), initial encounter: Secondary | ICD-10-CM | POA: Diagnosis not present

## 2023-01-19 DIAGNOSIS — M79671 Pain in right foot: Secondary | ICD-10-CM | POA: Diagnosis not present

## 2023-01-19 DIAGNOSIS — L03031 Cellulitis of right toe: Secondary | ICD-10-CM | POA: Diagnosis not present

## 2023-01-19 NOTE — Telephone Encounter (Signed)
Pt not in our system

## 2023-01-23 DIAGNOSIS — N289 Disorder of kidney and ureter, unspecified: Secondary | ICD-10-CM | POA: Diagnosis not present

## 2023-02-12 DIAGNOSIS — I2510 Atherosclerotic heart disease of native coronary artery without angina pectoris: Secondary | ICD-10-CM | POA: Diagnosis not present

## 2023-02-12 DIAGNOSIS — F0390 Unspecified dementia without behavioral disturbance: Secondary | ICD-10-CM | POA: Diagnosis not present

## 2023-02-12 DIAGNOSIS — I119 Hypertensive heart disease without heart failure: Secondary | ICD-10-CM | POA: Diagnosis not present

## 2023-02-12 DIAGNOSIS — F419 Anxiety disorder, unspecified: Secondary | ICD-10-CM | POA: Diagnosis not present

## 2023-02-12 DIAGNOSIS — Z681 Body mass index (BMI) 19 or less, adult: Secondary | ICD-10-CM | POA: Diagnosis not present

## 2023-02-12 DIAGNOSIS — M1990 Unspecified osteoarthritis, unspecified site: Secondary | ICD-10-CM | POA: Diagnosis not present

## 2023-02-12 DIAGNOSIS — E785 Hyperlipidemia, unspecified: Secondary | ICD-10-CM | POA: Diagnosis not present

## 2023-02-13 DIAGNOSIS — N179 Acute kidney failure, unspecified: Secondary | ICD-10-CM | POA: Diagnosis not present

## 2023-02-13 DIAGNOSIS — I10 Essential (primary) hypertension: Secondary | ICD-10-CM | POA: Diagnosis not present

## 2023-02-13 DIAGNOSIS — E559 Vitamin D deficiency, unspecified: Secondary | ICD-10-CM | POA: Diagnosis not present

## 2023-02-13 DIAGNOSIS — Z79899 Other long term (current) drug therapy: Secondary | ICD-10-CM | POA: Diagnosis not present

## 2023-02-21 DIAGNOSIS — F0390 Unspecified dementia without behavioral disturbance: Secondary | ICD-10-CM | POA: Diagnosis not present

## 2023-02-21 DIAGNOSIS — E8809 Other disorders of plasma-protein metabolism, not elsewhere classified: Secondary | ICD-10-CM | POA: Diagnosis not present

## 2023-02-21 DIAGNOSIS — N1831 Chronic kidney disease, stage 3a: Secondary | ICD-10-CM | POA: Diagnosis not present

## 2023-02-21 DIAGNOSIS — R799 Abnormal finding of blood chemistry, unspecified: Secondary | ICD-10-CM | POA: Diagnosis not present

## 2023-02-26 DIAGNOSIS — H6121 Impacted cerumen, right ear: Secondary | ICD-10-CM | POA: Diagnosis not present

## 2023-02-26 DIAGNOSIS — H903 Sensorineural hearing loss, bilateral: Secondary | ICD-10-CM | POA: Diagnosis not present

## 2023-03-02 DIAGNOSIS — F0390 Unspecified dementia without behavioral disturbance: Secondary | ICD-10-CM | POA: Diagnosis not present

## 2023-03-02 DIAGNOSIS — G4700 Insomnia, unspecified: Secondary | ICD-10-CM | POA: Diagnosis not present

## 2023-03-26 DIAGNOSIS — F0390 Unspecified dementia without behavioral disturbance: Secondary | ICD-10-CM | POA: Diagnosis not present

## 2023-03-26 DIAGNOSIS — G4700 Insomnia, unspecified: Secondary | ICD-10-CM | POA: Diagnosis not present

## 2023-04-15 NOTE — Progress Notes (Signed)
Cardiology Office Note:  .   Date:  04/16/2023  ID:  Christine Rich, DOB 18-Jul-1934, MRN [id number] PCP: Eloisa Northern, MD  [facility] Providers Cardiologist:  Norman Herrlich, MD    History of Present Illness: .   Christine Rich is a 89 y.o. female with a past medical history of CAD s/p CABG x 5 in 2013, hypertension, PVCs, bradycardia, GERD, hyperlipidemia.  07/15/2019 Lexiscan however study, findings consistent with ischemia with a reversible defect 06/06/2019 echocardiogram EF 55 to 60%, normal diastolic function, mild aortic regurgitation, mild AAS without evidence of stenosis, trace MR 06/05/2019 monitor heart range 44 to 117 bpm with an average 69 bpm.  1 triggered event associated with PVCs, no pauses greater than 3 seconds, 1 episode of NSVT 9 complexes. 10/17/2016 left heart cath severe stenosis of the LAD, circumflex, CTO of RCA, successful PCI/DES of the ostial and mid SVG to the posterior descending coronary artery.  Most recently evaluated by Dr. Dulce Sellar on 01/15/2023 for pedal edema, it was felt to be likely a side effect of Remeron however no changes were made to her medications or plan of care.    She presents today for follow-up of her CAD, she reports episodes of chest pain that can occur with exertion and are relieved by rest and massaging her chest.  These have been going on for some time for her have not increased with frequency or duration however they are bothersome.  She was recently moved to [place], she becomes tearful and is not sure if she will avoid it to go home.  She endorses some pedal edema however states it is better than when she was last evaluated in office.  She denies palpitations, dyspnea, pnd, orthopnea, n, v, dizziness, syncope, edema, weight gain, or early satiety.   ROS: Review of Systems  Constitutional: Negative.   HENT: Negative.    Eyes: Negative.   Respiratory: Negative.    Cardiovascular:  Positive for chest pain and leg swelling.   Gastrointestinal: Negative.   Genitourinary: Negative.   Musculoskeletal: Negative.   Skin: Negative.   Neurological: Negative.   Endo/Heme/Allergies: Negative.   Psychiatric/Behavioral:  Positive for memory loss.      Studies Reviewed: .        Cardiac Studies & Procedures     STRESS TESTS  MYOCARDIAL PERFUSION IMAGING 07/15/2019  Narrative  The left ventricular ejection fraction is hyperdynamic (>65%).  Nuclear stress EF: 70%.  There was no ST segment deviation noted during stress.  Defect 1: There is a medium defect of moderate severity present in the basal inferoseptal, basal inferior, mid inferoseptal and mid inferior location. This defect is reversible.  Findings consistent with ischemia.  This is a high risk study.     MONITORS  LONG TERM MONITOR (3-14 DAYS) 06/08/2019  Narrative A ZIO monitor was performed 6 days 23 hours beginning 05/20/2019 to assess symptomatic PVCs.  The rhythm throughout was sinus with minimum average and maximum heart rates of 44, 69 and 117 bpm.  The minimum rate was sinus bradycardia.  There was 1 single triggered event with frequent PVCs.  There were no pauses of 3 seconds or greater and no episodes of sinus node or AV nodal block.  Ventricular arrhythmia was occasional PVCs rare 181 couplets and rare triplets 21.  There was 1 episode of nonsustained ventricular tachycardia 9 complexes in a relatively slow rate of 139 bpm.  Supraventricular arrhythmia was rare with APCs.  There are no episodes of  atrial fibrillation or flutter.  18 brief episodes of atrial tachycardia were present the longest 14 complexes at a rate of 96 bpm and the fastest 4 complexes at a rate of 148 bpm.   Conclusion overall  occasional ventricular and rare supraventricular arrhythmia with a single triggered event showing frequent PVCs and one 9 beat run of nonsustained ventricular tachycardia.           Risk Assessment/Calculations:             Physical  Exam:   VS:  BP 120/78 (BP Location: Right Arm, Patient Position: Sitting, Cuff Size: Normal)   Pulse (!) 52   Ht 5\' 1"  (1.549 m)   Wt 113 lb 9.6 oz (51.5 kg)   SpO2 96%   BMI 21.46 kg/m    Wt Readings from Last 3 Encounters:  04/16/23 113 lb 9.6 oz (51.5 kg)  01/15/23 112 lb (50.8 kg)  12/21/22 112 lb 3.2 oz (50.9 kg)    GEN: Well nourished, well developed in no acute distress NECK: No JVD; No carotid bruits CARDIAC: RRR, no murmurs, rubs, gallops RESPIRATORY:  Clear to auscultation without rales, wheezing or rhonchi  ABDOMEN: Soft, non-tender, non-distended EXTREMITIES:  No edema; No deformity   ASSESSMENT AND PLAN: .   CAD/CABG x 5-she endorses episodes of stable angina that have been occurring for several years, has been out of her nitroglycerin so she has not been able to take this.  We will refill her nitroglycerin.  Will increase her Imdur to 60 mg daily to see if this helps with her episodes of chest pain.  Continue Plavix 75 mg daily, continue isosorbide but will increase to 60 mg daily, continue metoprolol 25 mg daily, continue Crestor 40 mg daily, will start her on Zetia 10 mg daily.  Hypertension-blood pressure is well-controlled at 120/78, continue losartan 25 mg daily.  Hyperlipidemia-most recent LDL was elevated at 92, normal LFTs, will start her on Zetia 10 mg daily.  Return in 8 weeks for repeat LFTs and FLP's.  PVCs-quiescent.  Continue metoprolol 25 mg daily.         Dispo: Return in 3 months with Dr. Dulce Sellar.  Increase Imdur to 60 mg daily, start Setia 10 mg daily, repeat FLP and LFTs in 8 weeks.  Signed, Flossie Dibble, NP

## 2023-04-16 ENCOUNTER — Ambulatory Visit: Payer: Medicare PPO | Attending: Cardiology | Admitting: Cardiology

## 2023-04-16 ENCOUNTER — Encounter: Payer: Self-pay | Admitting: Cardiology

## 2023-04-16 VITALS — BP 120/78 | HR 52 | Ht 61.0 in | Wt 113.6 lb

## 2023-04-16 DIAGNOSIS — E079 Disorder of thyroid, unspecified: Secondary | ICD-10-CM

## 2023-04-16 DIAGNOSIS — E538 Deficiency of other specified B group vitamins: Secondary | ICD-10-CM

## 2023-04-16 DIAGNOSIS — M542 Cervicalgia: Secondary | ICD-10-CM

## 2023-04-16 DIAGNOSIS — E042 Nontoxic multinodular goiter: Secondary | ICD-10-CM

## 2023-04-16 DIAGNOSIS — I25709 Atherosclerosis of coronary artery bypass graft(s), unspecified, with unspecified angina pectoris: Secondary | ICD-10-CM

## 2023-04-16 DIAGNOSIS — L720 Epidermal cyst: Secondary | ICD-10-CM

## 2023-04-16 DIAGNOSIS — E785 Hyperlipidemia, unspecified: Secondary | ICD-10-CM

## 2023-04-16 DIAGNOSIS — I493 Ventricular premature depolarization: Secondary | ICD-10-CM | POA: Diagnosis not present

## 2023-04-16 DIAGNOSIS — R0601 Orthopnea: Secondary | ICD-10-CM

## 2023-04-16 DIAGNOSIS — Z951 Presence of aortocoronary bypass graft: Secondary | ICD-10-CM

## 2023-04-16 DIAGNOSIS — I214 Non-ST elevation (NSTEMI) myocardial infarction: Secondary | ICD-10-CM

## 2023-04-16 DIAGNOSIS — I10 Essential (primary) hypertension: Secondary | ICD-10-CM | POA: Diagnosis not present

## 2023-04-16 DIAGNOSIS — R9389 Abnormal findings on diagnostic imaging of other specified body structures: Secondary | ICD-10-CM

## 2023-04-16 DIAGNOSIS — Z20822 Contact with and (suspected) exposure to covid-19: Secondary | ICD-10-CM

## 2023-04-16 DIAGNOSIS — S62339B Displaced fracture of neck of unspecified metacarpal bone, initial encounter for open fracture: Secondary | ICD-10-CM

## 2023-04-16 DIAGNOSIS — C50912 Malignant neoplasm of unspecified site of left female breast: Secondary | ICD-10-CM

## 2023-04-16 DIAGNOSIS — K862 Cyst of pancreas: Secondary | ICD-10-CM

## 2023-04-16 DIAGNOSIS — Z5329 Procedure and treatment not carried out because of patient's decision for other reasons: Secondary | ICD-10-CM

## 2023-04-16 DIAGNOSIS — R059 Cough, unspecified: Secondary | ICD-10-CM

## 2023-04-16 DIAGNOSIS — M47812 Spondylosis without myelopathy or radiculopathy, cervical region: Secondary | ICD-10-CM

## 2023-04-16 DIAGNOSIS — I2510 Atherosclerotic heart disease of native coronary artery without angina pectoris: Secondary | ICD-10-CM

## 2023-04-16 DIAGNOSIS — R0609 Other forms of dyspnea: Secondary | ICD-10-CM

## 2023-04-16 DIAGNOSIS — E782 Mixed hyperlipidemia: Secondary | ICD-10-CM | POA: Diagnosis not present

## 2023-04-16 DIAGNOSIS — R221 Localized swelling, mass and lump, neck: Secondary | ICD-10-CM

## 2023-04-16 DIAGNOSIS — F419 Anxiety disorder, unspecified: Secondary | ICD-10-CM

## 2023-04-16 DIAGNOSIS — R202 Paresthesia of skin: Secondary | ICD-10-CM

## 2023-04-16 DIAGNOSIS — R079 Chest pain, unspecified: Secondary | ICD-10-CM

## 2023-04-16 DIAGNOSIS — R001 Bradycardia, unspecified: Secondary | ICD-10-CM | POA: Diagnosis not present

## 2023-04-16 DIAGNOSIS — J209 Acute bronchitis, unspecified: Secondary | ICD-10-CM

## 2023-04-16 DIAGNOSIS — C07 Malignant neoplasm of parotid gland: Secondary | ICD-10-CM

## 2023-04-16 DIAGNOSIS — R531 Weakness: Secondary | ICD-10-CM

## 2023-04-16 DIAGNOSIS — I2089 Other forms of angina pectoris: Secondary | ICD-10-CM

## 2023-04-16 DIAGNOSIS — K219 Gastro-esophageal reflux disease without esophagitis: Secondary | ICD-10-CM

## 2023-04-16 MED ORDER — NITROGLYCERIN 0.4 MG SL SUBL: 0.4000 mg | SUBLINGUAL_TABLET | SUBLINGUAL | 1 refills | Status: AC | PRN

## 2023-04-16 MED ORDER — EZETIMIBE 10 MG PO TABS: 10.0000 mg | ORAL_TABLET | Freq: Every day | ORAL | 3 refills | Status: AC

## 2023-04-16 MED ORDER — ISOSORBIDE MONONITRATE ER 60 MG PO TB24: 60.0000 mg | ORAL_TABLET | Freq: Every day | ORAL | 3 refills | Status: AC

## 2023-04-16 NOTE — Patient Instructions (Signed)
Medication Instructions:  Your physician has recommended you make the following change in your medication:   START: Zetia 10 mg daily START: Imdur 60 mg daily  *If you need a refill on your cardiac medications before your next appointment, please call your pharmacy*   Lab Work: Your physician recommends that you return for lab work in:   Labs in 8 weeks (around 9/1): Lipids, LFT  If you have labs (blood work) drawn today and your tests are completely normal, you will receive your results only by: MyChart Message (if you have MyChart) OR A paper copy in the mail If you have any lab test that is abnormal or we need to change your treatment, we will call you to review the results.   Testing/Procedures: None   Follow-Up: At [facility], you and your health needs are our priority.  As part of our continuing mission to provide you with exceptional heart care, we have created designated Provider Care Teams.  These Care Teams include your primary Cardiologist (physician) and Advanced Practice Providers (APPs -  Physician Assistants and Nurse Practitioners) who all work together to provide you with the care you need, when you need it.  We recommend signing up for the patient portal called "MyChart".  Sign up information is provided on this After Visit Summary.  MyChart is used to connect with patients for Virtual Visits (Telemedicine).  Patients are able to view lab/test results, encounter notes, upcoming appointments, etc.  Non-urgent messages can be sent to your provider as well.   To learn more about what you can do with MyChart, go to [email or web address].    Your next appointment:   3 month(s)  Provider:   Norman Herrlich, MD    Other Instructions None

## 2023-04-25 ENCOUNTER — Ambulatory Visit: Payer: Medicare PPO | Admitting: Allergy and Immunology

## 2023-04-25 ENCOUNTER — Encounter: Payer: Self-pay | Admitting: Allergy and Immunology

## 2023-04-25 VITALS — BP 104/64 | HR 70 | Resp 18

## 2023-04-25 DIAGNOSIS — J3089 Other allergic rhinitis: Secondary | ICD-10-CM

## 2023-04-25 DIAGNOSIS — H903 Sensorineural hearing loss, bilateral: Secondary | ICD-10-CM

## 2023-04-25 NOTE — Patient Instructions (Addendum)
  1.  If needed:    A. OTC Nasacort - 1 spray each nostril 2 time per day (AM + PM)  B. Ipratropium 0.06% - 2 sprays each nostril 2 times per day (AM + PM)  2. Get a hearing aid from [facility] on [address]  3. Return to clinic in 12  months or earlier if problem  4. Plan for fall flu vaccine

## 2023-04-25 NOTE — Progress Notes (Signed)
[place] - [place] - [place] - [place] - [place]   Follow-up Note  Referring Provider: Street, Stephanie Coup, * Primary Provider: Eloisa Northern, MD Date of Office Visit: 04/25/2023  Subjective:   Christine Rich (DOB: [birth year]) is a 89 y.o. female who [facility] in re-evaluation of the following:  HPI: Christine Rich returns to this clinic in evaluation of nonallergic rhinitis and throat clearing and coughing.  I last saw her in this clinic for January 2024.  She has resolved her cough and her throat clearing and her nasal issues and is tapered off all of her medications and is doing quite well at this point in time.  She is now in assisted living.  She still cannot hear very well.  She did visit with ENT who recommended hearing aids.  Allergies as of 04/25/2023   No Known Allergies      Medication List    ALPRAZolam 0.25 MG tablet Commonly known as: XANAX Take 0.25 mg by mouth at bedtime as needed for anxiety or sleep.   aspirin EC 81 MG tablet Take 81 mg by mouth daily.   cetirizine 10 MG tablet Commonly known as: ZYRTEC Take 1 tablet by mouth daily.   clopidogrel 75 MG tablet Commonly known as: PLAVIX Take 1 tablet (75 mg total) by mouth daily.   cyanocobalamin 1000 MCG tablet Take 1,000 mcg by mouth daily.   ezetimibe 10 MG tablet Commonly known as: ZETIA Take 1 tablet (10 mg total) by mouth daily.   ipratropium 0.06 % nasal spray Commonly known as: ATROVENT Use 2 sprays in each nostril twice daily   isosorbide mononitrate 60 MG 24 hr tablet Commonly known as: IMDUR Take 1 tablet (60 mg total) by mouth daily.   losartan 25 MG tablet Commonly known as: COZAAR Take 1 tablet (25 mg total) by mouth daily.   metoprolol succinate 25 MG 24 hr tablet Commonly known as: TOPROL-XL Take 25 mg by mouth daily.   mirtazapine 7.5 MG tablet Commonly known as: REMERON Take 7.5 mg by mouth at bedtime.   Nasacort  Allergy 24HR 55 MCG/ACT Aero nasal inhaler Generic drug: triamcinolone Place 2 sprays into the nose daily.   nitroGLYCERIN 0.4 MG SL tablet Commonly known as: NITROSTAT Place 1 tablet (0.4 mg total) under the tongue every 5 (five) minutes as needed for chest pain.   PRESERVISION AREDS 2 PO Take 1 tablet by mouth daily.   rosuvastatin 40 MG tablet Commonly known as: CRESTOR Take 1 tablet (40 mg total) by mouth daily.   traMADol 50 MG tablet Commonly known as: ULTRAM Take by mouth every 6 (six) hours as needed.    Past Medical History:  Diagnosis Date  . Abnormal CXR 06/13/2019  . Accelerated hypertension 03/15/2022  . Acute bronchitis 03/15/2022  . Angina at rest 03/15/2022  . Anxiety 03/15/2022  . Anxiety and depression 03/15/2022   Last Assessment & Plan:  Formatting of this note might be different from the original. - not on meds at home, clinically very anxious, noted received buspirone 5mg  x1 at midnight  - support provided  . Boxers fracture 03/15/2022  . CAD (coronary artery disease) of artery bypass graft 03/15/2022  . Chest pain 06/06/2019   Last Assessment & Plan:  Formatting of this note might be different from the original. - intermittent chest pain over the past 1-2 weeks, currently pain free   - will check 2nd trop, CK today - EKG non-specific,  start telemetry monitor  - cardiology consult pending  - Echo pending  . Coronary artery disease involving native coronary artery of native heart 11/17/2015   Overview:  s/p CABGSx5 in 2003Lexiscan MPS in Aug 2011 without ischemia, EF 65% Cardiolite 10/29/12: CONCLUSIONS:  Study quality: fair, with obvious inferior attenuation artifact reducing the sensitivity of the test  (1) diaphragm attenuation artifact - prior non-transmural infarction cannot be excluded  (2) normal LV size, with ? inferior hypokinesis - measured LVEF of 48% may be an underestimate  (3) no areas of (vasodilator) stress-induced hypoperfusion are identified.  Negative  perfusion stress test for potenital ischemia.   Cardiac catheterization January 2018 drug-eluting stent to graft going to the posterior descending artery  . Cough 06/13/2019  . Cyst of skin and subcutaneous tissue 10/12/2020  . Dyspnea on exertion 03/15/2022  . Essential hypertension 11/17/2015  . GERD (gastroesophageal reflux disease) 03/15/2022  . Hx of CABG 03/15/2022  . Hyperlipemia 11/17/2015  . Hyperlipidemia 03/15/2022  . Left against medical advice 03/15/2022  . Malignant neoplasm of left female breast (HCC) 01/23/2018  . Neck mass 10/11/2020  . Nontoxic multinodular goiter 08/14/2016  . NSTEMI (non-ST elevated myocardial infarction) (HCC) 10/18/2016  . Orthopnea 03/15/2022  . Pancreatic cyst 03/15/2022  . Paresthesia 11/27/2019  . Posterior neck pain 01/23/2018  . 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 PVCs (premature ventricular contractions) 11/17/2015  . Spondylosis of cervical region without myelopathy or radiculopathy 01/23/2018  . Suspected COVID-19 virus infection 06/06/2019   Last Assessment & Plan:  Formatting of this note might be different from the original. - cough, fatigue, weakness for 1-2 weeks - nonspecific symptoms, will r/o COVID-19  . Thyroid mass 11/30/2020  . Vitamin B 12 deficiency   . Weakness 06/06/2019   Last Assessment & Plan:  Formatting of this note might be different from the original. - TSH WNL - B12 low, replacement as below  - r/o ACS, COVID-19, likely due to age and debility , PT to see    Past Surgical History:  Procedure Laterality Date  . ABDOMINAL HYSTERECTOMY    . CORONARY ARTERY BYPASS GRAFT    . MASTECTOMY      Review of systems negative except as noted in HPI / PMHx or noted below:  Review of Systems  Constitutional: Negative.   HENT: Negative.    Eyes: Negative.   Respiratory: Negative.    Cardiovascular: Negative.   Gastrointestinal: Negative.   Genitourinary: Negative.   Musculoskeletal: Negative.   Skin: Negative.    Neurological: Negative.   Endo/Heme/Allergies: Negative.   Psychiatric/Behavioral: Negative.       Objective:   Vitals:   04/25/23 1021  BP: 104/64  Pulse: 70  Resp: 18  SpO2: 95%          Physical Exam Constitutional:      Appearance: She is not diaphoretic.  HENT:     Head: Normocephalic.     Right Ear: Tympanic membrane, ear canal and external ear normal.     Left Ear: Tympanic membrane, ear canal and external ear normal.     Nose: Nose normal. No mucosal edema or rhinorrhea.     Mouth/Throat:     Pharynx: Uvula midline. No oropharyngeal exudate.  Eyes:     Conjunctiva/sclera: Conjunctivae normal.  Neck:     Thyroid: No thyromegaly.     Trachea: Trachea normal. No tracheal tenderness or tracheal deviation.  Cardiovascular:     Rate and Rhythm: Normal rate  and regular rhythm.     Heart sounds: Normal heart sounds, S1 normal and S2 normal. No murmur heard. Pulmonary:     Effort: No respiratory distress.     Breath sounds: Normal breath sounds. No stridor. No wheezing or rales.  Lymphadenopathy:     Head:     Right side of head: No tonsillar adenopathy.     Left side of head: No tonsillar adenopathy.     Cervical: No cervical adenopathy.  Skin:    Findings: No erythema or rash.     Nails: There is no clubbing.  Neurological:     Mental Status: She is alert.    Diagnostics:    Spirometry was performed and demonstrated an FEV1 of *** at *** % of predicted.  The patient had an Asthma Control Test with the following results:  .    Assessment and Plan:   No diagnosis found.  Patient Instructions   1.  If needed:    A. OTC Nasacort - 1 spray each nostril 2 time per day (AM + PM)  B. Ipratropium 0.06% - 2 sprays each nostril 2 times per day (AM + PM)  2. Get a hearing aid from [facility] on [address]  3. Return to clinic in 12  months or earlier if problem  4. Plan for fall flu vaccine  Laurette Schimke, MD Allergy / Immunology [facility]

## 2023-04-26 ENCOUNTER — Encounter: Payer: Self-pay | Admitting: Allergy and Immunology

## 2023-05-08 DIAGNOSIS — B351 Tinea unguium: Secondary | ICD-10-CM | POA: Diagnosis not present

## 2023-05-08 DIAGNOSIS — I70203 Unspecified atherosclerosis of native arteries of extremities, bilateral legs: Secondary | ICD-10-CM | POA: Diagnosis not present

## 2023-05-08 DIAGNOSIS — M2042 Other hammer toe(s) (acquired), left foot: Secondary | ICD-10-CM | POA: Diagnosis not present

## 2023-05-08 DIAGNOSIS — M2041 Other hammer toe(s) (acquired), right foot: Secondary | ICD-10-CM | POA: Diagnosis not present

## 2023-05-23 DIAGNOSIS — F419 Anxiety disorder, unspecified: Secondary | ICD-10-CM | POA: Diagnosis not present

## 2023-05-23 DIAGNOSIS — F03918 Unspecified dementia, unspecified severity, with other behavioral disturbance: Secondary | ICD-10-CM | POA: Diagnosis not present

## 2023-06-20 DIAGNOSIS — F4322 Adjustment disorder with anxiety: Secondary | ICD-10-CM | POA: Diagnosis not present

## 2023-06-20 DIAGNOSIS — F0284 Dementia in other diseases classified elsewhere, unspecified severity, with anxiety: Secondary | ICD-10-CM | POA: Diagnosis not present

## 2023-06-20 DIAGNOSIS — G301 Alzheimer's disease with late onset: Secondary | ICD-10-CM | POA: Diagnosis not present

## 2023-07-11 DIAGNOSIS — I2510 Atherosclerotic heart disease of native coronary artery without angina pectoris: Secondary | ICD-10-CM | POA: Diagnosis not present

## 2023-07-11 DIAGNOSIS — I129 Hypertensive chronic kidney disease with stage 1 through stage 4 chronic kidney disease, or unspecified chronic kidney disease: Secondary | ICD-10-CM | POA: Diagnosis not present

## 2023-07-11 DIAGNOSIS — M1990 Unspecified osteoarthritis, unspecified site: Secondary | ICD-10-CM | POA: Diagnosis not present

## 2023-07-11 DIAGNOSIS — E785 Hyperlipidemia, unspecified: Secondary | ICD-10-CM | POA: Diagnosis not present

## 2023-07-11 DIAGNOSIS — F419 Anxiety disorder, unspecified: Secondary | ICD-10-CM | POA: Diagnosis not present

## 2023-07-11 DIAGNOSIS — F0390 Unspecified dementia without behavioral disturbance: Secondary | ICD-10-CM | POA: Diagnosis not present

## 2023-07-11 DIAGNOSIS — N1831 Chronic kidney disease, stage 3a: Secondary | ICD-10-CM | POA: Diagnosis not present

## 2023-07-11 NOTE — Progress Notes (Unsigned)
Cardiology Office Note:    Date:  07/12/2023   ID:  Christine Rich, DOB 05/13/1934, MRN [id number]  PCP:  Street, Stephanie Coup, MD  Cardiologist:  Norman Herrlich, MD    Referring MD: Eloisa Northern, MD    ASSESSMENT:    1. Coronary artery disease involving native coronary artery of native heart, unspecified whether angina present   2. Essential hypertension   3. Mixed hyperlipidemia   4. PVCs (premature ventricular contractions)   5. Dementia, unspecified dementia severity, unspecified dementia type, unspecified whether behavioral, psychotic, or mood disturbance or anxiety (HCC)    PLAN:    In order of problems listed above:  At this point in her life I think she is doing well with her CAD will continue dual antiplatelet therapy oral nitrate beta-blocker and her statin I be very hesitant to consider cardiac interventions Well-controlled repeat blood pressure at target on her ARB Continue her statin for safety check CMP renal function potassium and lipid profile No PVCs on her EKG no complaints of palpitation she will continue her beta-blocker Seems to be improved she is less unhappy today behavior is better   Next appointment: 6 months   Medication Adjustments/Labs and Tests Ordered: Current medicines are reviewed at length with the patient today.  Concerns regarding medicines are outlined above.  Orders Placed This Encounter  Procedures   EKG 12-Lead   No orders of the defined types were placed in this encounter.    History of Present Illness:    Christine Rich is a 89 y.o. female with a hx of CAD with CABG 2013 non-ST elevation MI January 2019 PCI drug-eluting stent to the vein graft posterior descending coronary artery dyslipidemia hypertension and frequent PVCs last seen 01/15/2023 with marked edema .  Compliance with diet, lifestyle and medications: Yes  She is less unhappy today and is not having cardiovascular symptoms of edema shortness of breath chest pain  palpitation or syncope Her son came but she had him leave the room Past Medical History:  Diagnosis Date   Abnormal CXR 06/13/2019   Accelerated hypertension 03/15/2022   Acute bronchitis 03/15/2022   Angina at rest 03/15/2022   Anxiety 03/15/2022   Anxiety and depression 03/15/2022   Last Assessment & Plan:  Formatting of this note might be different from the original. - not on meds at home, clinically very anxious, noted received buspirone 5mg  x1 at midnight  - support provided   Boxers fracture 03/15/2022   CAD (coronary artery disease) of artery bypass graft 03/15/2022   Chest pain 06/06/2019   Last Assessment & Plan:  Formatting of this note might be different from the original. - intermittent chest pain over the past 1-2 weeks, currently pain free   - will check 2nd trop, CK today - EKG non-specific, start telemetry monitor  - cardiology consult pending  - Echo pending   Coronary artery disease involving native coronary artery of native heart 11/17/2015   Overview:  s/p CABGSx5 in 2003Lexiscan MPS in Aug 2011 without ischemia, EF 65% Cardiolite 10/29/12: CONCLUSIONS:  Study quality: fair, with obvious inferior attenuation artifact reducing the sensitivity of the test  (1) diaphragm attenuation artifact - prior non-transmural infarction cannot be excluded  (2) normal LV size, with ? inferior hypokinesis - measured LVEF of 48% may be an underestimate  (3) no areas of (vasodilator) stress-induced hypoperfusion are identified.  Negative perfusion stress test for potenital ischemia.   Cardiac catheterization January 2018 drug-eluting stent to graft going to  the posterior descending artery   Cough 06/13/2019   Cyst of skin and subcutaneous tissue 10/12/2020   Dyspnea on exertion 03/15/2022   Essential hypertension 11/17/2015   GERD (gastroesophageal reflux disease) 03/15/2022   Hx of CABG 03/15/2022   Hyperlipemia 11/17/2015   Hyperlipidemia 03/15/2022   Left against medical advice 03/15/2022   Malignant  neoplasm of left female breast (HCC) 01/23/2018   Neck mass 10/11/2020   Nontoxic multinodular goiter 08/14/2016   NSTEMI (non-ST elevated myocardial infarction) (HCC) 10/18/2016   Orthopnea 03/15/2022   Pancreatic cyst 03/15/2022   Paresthesia 11/27/2019   Posterior neck pain 01/23/2018   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 - cough, fatigue, weakness for 1-2 weeks - nonspecific symptoms, will r/o COVID-19   Thyroid mass 11/30/2020   Vitamin B 12 deficiency    Weakness 06/06/2019   Last Assessment & Plan:  Formatting of this note might be different from the original. - TSH WNL - B12 low, replacement as below  - r/o ACS, COVID-19, likely due to age and debility , PT to see    Current Medications: Current Meds  Medication Sig   ALPRAZolam (XANAX) 0.25 MG tablet Take 0.25 mg by mouth at bedtime as needed for anxiety or sleep.   aspirin EC 81 MG tablet Take 81 mg by mouth daily.   cetirizine (ZYRTEC) 10 MG tablet Take 1 tablet by mouth daily.   clopidogrel (PLAVIX) 75 MG tablet Take 1 tablet (75 mg total) by mouth daily.   cyanocobalamin 1000 MCG tablet Take 1,000 mcg by mouth daily.   ezetimibe (ZETIA) 10 MG tablet Take 1 tablet (10 mg total) by mouth daily.   ipratropium (ATROVENT) 0.06 % nasal spray Use 2 sprays in each nostril twice daily   isosorbide mononitrate (IMDUR) 60 MG 24 hr tablet Take 1 tablet (60 mg total) by mouth daily.   losartan (COZAAR) 25 MG tablet Take 1 tablet (25 mg total) by mouth daily.   metoprolol succinate (TOPROL-XL) 25 MG 24 hr tablet Take 25 mg by mouth daily.   mirtazapine (REMERON) 7.5 MG tablet Take 7.5 mg by mouth at bedtime.   Multiple Vitamins-Minerals (PRESERVISION AREDS  2 PO) Take 1 tablet by mouth daily.   nitroGLYCERIN (NITROSTAT) 0.4 MG SL tablet Place 1 tablet (0.4 mg total) under the tongue every 5 (five) minutes as needed for chest pain.   rosuvastatin (CRESTOR) 40 MG tablet Take 1 tablet (40 mg total) by mouth daily.   traMADol (ULTRAM) 50 MG tablet Take by mouth every 6 (six) hours as needed.   triamcinolone (NASACORT ALLERGY 24HR) 55 MCG/ACT AERO nasal inhaler Place 2 sprays into the nose daily.      EKGs/Labs/Other Studies Reviewed:    The following studies were reviewed today:  Cardiac Studies & Procedures     STRESS TESTS  MYOCARDIAL PERFUSION IMAGING 07/15/2019  Narrative  The left ventricular ejection fraction is hyperdynamic (>65%).  Nuclear stress EF: 70%.  There was no ST segment deviation noted during stress.  Defect 1: There is a medium defect of moderate severity present in the basal inferoseptal, basal inferior, mid inferoseptal and mid inferior location. This defect is reversible.  Findings consistent with ischemia.  This is a high risk study.  MONITORS  LONG TERM MONITOR (3-14 DAYS) 06/05/2019  Narrative A ZIO monitor was performed 6 days 23 hours beginning 05/20/2019 to assess symptomatic PVCs.  The rhythm throughout was sinus with minimum average and maximum heart rates of 44, 69 and 117 bpm.  The minimum rate was sinus bradycardia.  There was 1 single triggered event with frequent PVCs.  There were no pauses of 3 seconds or greater and no episodes of sinus node or AV nodal block.  Ventricular arrhythmia was occasional PVCs rare 181 couplets and rare triplets 21.  There was 1 episode of nonsustained ventricular tachycardia 9 complexes in a relatively slow rate of 139 bpm.  Supraventricular arrhythmia was rare with APCs.  There are no episodes of atrial fibrillation or flutter.  18 brief episodes of atrial tachycardia were present the longest 14 complexes at a rate of 96 bpm and the fastest 4 complexes at a  rate of 148 bpm.   Conclusion overall  occasional ventricular and rare supraventricular arrhythmia with a single triggered event showing frequent PVCs and one 9 beat run of nonsustained ventricular tachycardia.           EKG Interpretation Date/Time:  Thursday July 12 2023 10:19:43 EDT Ventricular Rate:  68 PR Interval:  232 QRS Duration:  142 QT Interval:  424 QTC Calculation: 450 R Axis:   80  Text Interpretation: Sinus rhythm with APC's and sinus arrhythmia with 1st degree A-V block Right bundle branch block ST and T wave abnormality, consider lateral ischemia When compared with ECG of 28-Jan-2002 10:21, Premature ectopic complexes are no longer Present PR interval has increased Right bundle branch block is now Present Septal infarct is now Present Confirmed by Norman Herrlich ([id number]) on 07/12/2023 10:24:48 AM   Recent Labs: 12/21/2022: ALT 15; BUN 25; Creatinine, Ser 1.02; Potassium 4.5; Sodium 143  Recent Lipid Panel    Component Value Date/Time   CHOL 165 12/21/2022 1536   TRIG 158 (H) 12/21/2022 1536   HDL 46 12/21/2022 1536   CHOLHDL 3.6 12/21/2022 1536   LDLCALC 92 12/21/2022 1536    Physical Exam:    VS:  BP (!) 152/88 (BP Location: Right Arm, Patient Position: Sitting, Cuff Size: Normal)   Pulse 68   Ht 5\' 1"  (1.549 m)   Wt 119 lb 9.6 oz (54.3 kg)   SpO2 98%   BMI 22.60 kg/m     Wt Readings from Last 3 Encounters:  07/12/23 119 lb 9.6 oz (54.3 kg)  04/16/23 113 lb 9.6 oz (51.5 kg)  01/15/23 112 lb (50.8 kg)     GEN:  Well nourished, well developed in no acute distress HEENT: Normal NECK: No JVD; No carotid bruits LYMPHATICS: No lymphadenopathy CARDIAC: RRR, no murmurs, rubs, gallops RESPIRATORY:  Clear to auscultation without rales, wheezing or rhonchi  ABDOMEN: Soft, non-tender, non-distended MUSCULOSKELETAL:  No edema; No deformity  SKIN: Warm and dry NEUROLOGIC:  Alert and oriented x 3 PSYCHIATRIC:  Normal affect    Signed, Norman Herrlich, MD   07/12/2023 10:32 AM    [facility]

## 2023-07-12 ENCOUNTER — Encounter: Payer: Self-pay | Admitting: Cardiology

## 2023-07-12 ENCOUNTER — Ambulatory Visit: Payer: Medicare PPO | Attending: Cardiology | Admitting: Cardiology

## 2023-07-12 ENCOUNTER — Telehealth: Payer: Self-pay | Admitting: Cardiology

## 2023-07-12 VITALS — BP 136/80 | HR 68 | Ht 61.0 in | Wt 119.6 lb

## 2023-07-12 DIAGNOSIS — I2510 Atherosclerotic heart disease of native coronary artery without angina pectoris: Secondary | ICD-10-CM

## 2023-07-12 DIAGNOSIS — E782 Mixed hyperlipidemia: Secondary | ICD-10-CM | POA: Diagnosis not present

## 2023-07-12 DIAGNOSIS — I10 Essential (primary) hypertension: Secondary | ICD-10-CM

## 2023-07-12 DIAGNOSIS — F0390 Unspecified dementia without behavioral disturbance: Secondary | ICD-10-CM | POA: Diagnosis not present

## 2023-07-12 DIAGNOSIS — I493 Ventricular premature depolarization: Secondary | ICD-10-CM | POA: Diagnosis not present

## 2023-07-12 DIAGNOSIS — D649 Anemia, unspecified: Secondary | ICD-10-CM | POA: Diagnosis not present

## 2023-07-12 LAB — COMPREHENSIVE METABOLIC PANEL
ALT: 12 IU/L (ref 0–32)
AST: 21 IU/L (ref 0–40)
Albumin: 4.4 g/dL (ref 3.7–4.7)
Alkaline Phosphatase: 74 IU/L (ref 44–121)
BUN/Creatinine Ratio: 15 (ref 12–28)
BUN: 17 mg/dL (ref 8–27)
Bilirubin Total: 0.6 mg/dL (ref 0.0–1.2)
CO2: 26 mmol/L (ref 20–29)
Calcium: 10 mg/dL (ref 8.7–10.3)
Chloride: 104 mmol/L (ref 96–106)
Creatinine, Ser: 1.1 mg/dL — ABNORMAL HIGH (ref 0.57–1.00)
Globulin, Total: 2.5 g/dL (ref 1.5–4.5)
Glucose: 84 mg/dL (ref 70–99)
Potassium: 3.9 mmol/L (ref 3.5–5.2)
Sodium: 142 mmol/L (ref 134–144)
Total Protein: 6.9 g/dL (ref 6.0–8.5)
eGFR: 49 mL/min/{1.73_m2} — ABNORMAL LOW (ref >59)

## 2023-07-12 LAB — LIPID PANEL
Chol/HDL Ratio: 4.3 ratio (ref 0.0–4.4)
Cholesterol, Total: 238 mg/dL — ABNORMAL HIGH (ref 100–199)
HDL: 55 mg/dL (ref >39)
LDL Chol Calc (NIH): 159 mg/dL — ABNORMAL HIGH (ref 0–99)
Triglycerides: 134 mg/dL (ref 0–149)
VLDL Cholesterol Cal: 24 mg/dL (ref 5–40)

## 2023-07-12 NOTE — Patient Instructions (Signed)
Medication Instructions:  Your physician recommends that you continue on your current medications as directed. Please refer to the Current Medication list given to you today.  *If you need a refill on your cardiac medications before your next appointment, please call your pharmacy*   Lab Work: Your physician recommends that you return for lab work in:   Labs today: CMP, Lipid  If you have labs (blood work) drawn today and your tests are completely normal, you will receive your results only by: MyChart Message (if you have MyChart) OR A paper copy in the mail If you have any lab test that is abnormal or we need to change your treatment, we will call you to review the results.   Testing/Procedures: None   Follow-Up: At [facility], you and your health needs are our priority.  As part of our continuing mission to provide you with exceptional heart care, we have created designated Provider Care Teams.  These Care Teams include your primary Cardiologist (physician) and Advanced Practice Providers (APPs -  Physician Assistants and Nurse Practitioners) who all work together to provide you with the care you need, when you need it.  We recommend signing up for the patient portal called "MyChart".  Sign up information is provided on this After Visit Summary.  MyChart is used to connect with patients for Virtual Visits (Telemedicine).  Patients are able to view lab/test results, encounter notes, upcoming appointments, etc.  Non-urgent messages can be sent to your provider as well.   To learn more about what you can do with MyChart, go to [email or web address].    Your next appointment:   6 month(s)  Provider:   Norman Herrlich, MD    Other Instructions None

## 2023-07-12 NOTE — Telephone Encounter (Signed)
Calling to get patient labs fax over to them, so that can draw them. Fax [phone]. Please advise

## 2023-07-12 NOTE — Telephone Encounter (Signed)
Faxed lab orders to [phone].

## 2023-07-16 DIAGNOSIS — N1831 Chronic kidney disease, stage 3a: Secondary | ICD-10-CM

## 2023-07-16 DIAGNOSIS — F0390 Unspecified dementia without behavioral disturbance: Secondary | ICD-10-CM

## 2023-07-18 DIAGNOSIS — F0284 Dementia in other diseases classified elsewhere, unspecified severity, with anxiety: Secondary | ICD-10-CM | POA: Diagnosis not present

## 2023-07-18 DIAGNOSIS — F419 Anxiety disorder, unspecified: Secondary | ICD-10-CM | POA: Diagnosis not present

## 2023-07-18 DIAGNOSIS — G301 Alzheimer's disease with late onset: Secondary | ICD-10-CM | POA: Diagnosis not present

## 2023-07-20 DIAGNOSIS — Z23 Encounter for immunization: Secondary | ICD-10-CM | POA: Diagnosis not present

## 2023-07-24 ENCOUNTER — Telehealth: Payer: Self-pay

## 2023-07-24 NOTE — Telephone Encounter (Signed)
-----   Message from Nurse Ladonna Snide F sent at 07/13/2023  7:13 AM EDT -----  ----- Message ----- From: Baldo Daub, MD Sent: 07/12/2023   5:18 PM EDT To: Mickie Bail Ash/Hp Triage  Look straight forward she is not taking her statin  She is in the nursing home  Please send a message for them to be sure that she is a currently taking Zetia 10 mg daily and rosuvastatin 40 mg/day

## 2023-07-24 NOTE — Telephone Encounter (Signed)
Lvm to return call

## 2023-07-25 DIAGNOSIS — J309 Allergic rhinitis, unspecified: Secondary | ICD-10-CM | POA: Diagnosis not present

## 2023-07-25 DIAGNOSIS — F0390 Unspecified dementia without behavioral disturbance: Secondary | ICD-10-CM | POA: Diagnosis not present

## 2023-08-10 ENCOUNTER — Encounter: Payer: Self-pay | Admitting: Cardiology

## 2023-08-22 DIAGNOSIS — F419 Anxiety disorder, unspecified: Secondary | ICD-10-CM | POA: Diagnosis not present

## 2023-08-22 DIAGNOSIS — F0284 Dementia in other diseases classified elsewhere, unspecified severity, with anxiety: Secondary | ICD-10-CM | POA: Diagnosis not present

## 2023-08-22 DIAGNOSIS — G301 Alzheimer's disease with late onset: Secondary | ICD-10-CM | POA: Diagnosis not present

## 2023-09-18 DIAGNOSIS — G301 Alzheimer's disease with late onset: Secondary | ICD-10-CM | POA: Diagnosis not present

## 2023-09-18 DIAGNOSIS — F0284 Dementia in other diseases classified elsewhere, unspecified severity, with anxiety: Secondary | ICD-10-CM | POA: Diagnosis not present

## 2023-09-18 DIAGNOSIS — F419 Anxiety disorder, unspecified: Secondary | ICD-10-CM | POA: Diagnosis not present

## 2023-10-23 DIAGNOSIS — G301 Alzheimer's disease with late onset: Secondary | ICD-10-CM | POA: Diagnosis not present

## 2023-10-23 DIAGNOSIS — F419 Anxiety disorder, unspecified: Secondary | ICD-10-CM | POA: Diagnosis not present

## 2023-10-23 DIAGNOSIS — F0284 Dementia in other diseases classified elsewhere, unspecified severity, with anxiety: Secondary | ICD-10-CM | POA: Diagnosis not present

## 2023-11-09 DIAGNOSIS — F0390 Unspecified dementia without behavioral disturbance: Secondary | ICD-10-CM | POA: Diagnosis not present

## 2023-11-09 DIAGNOSIS — R5381 Other malaise: Secondary | ICD-10-CM | POA: Diagnosis not present

## 2023-11-09 DIAGNOSIS — R0989 Other specified symptoms and signs involving the circulatory and respiratory systems: Secondary | ICD-10-CM | POA: Diagnosis not present

## 2023-11-13 ENCOUNTER — Other Ambulatory Visit: Payer: Self-pay | Admitting: Internal Medicine

## 2023-11-19 ENCOUNTER — Other Ambulatory Visit: Payer: Self-pay | Admitting: Internal Medicine

## 2023-11-20 DIAGNOSIS — F419 Anxiety disorder, unspecified: Secondary | ICD-10-CM | POA: Diagnosis not present

## 2023-11-20 DIAGNOSIS — G301 Alzheimer's disease with late onset: Secondary | ICD-10-CM | POA: Diagnosis not present

## 2023-11-20 DIAGNOSIS — F0284 Dementia in other diseases classified elsewhere, unspecified severity, with anxiety: Secondary | ICD-10-CM | POA: Diagnosis not present

## 2023-12-05 DIAGNOSIS — F0390 Unspecified dementia without behavioral disturbance: Secondary | ICD-10-CM | POA: Diagnosis not present

## 2023-12-05 DIAGNOSIS — M6281 Muscle weakness (generalized): Secondary | ICD-10-CM | POA: Diagnosis not present

## 2023-12-05 DIAGNOSIS — I700 Atherosclerosis of aorta: Secondary | ICD-10-CM | POA: Diagnosis not present

## 2023-12-05 DIAGNOSIS — M1990 Unspecified osteoarthritis, unspecified site: Secondary | ICD-10-CM | POA: Diagnosis not present

## 2023-12-05 DIAGNOSIS — N1831 Chronic kidney disease, stage 3a: Secondary | ICD-10-CM | POA: Diagnosis not present

## 2023-12-05 DIAGNOSIS — I2510 Atherosclerotic heart disease of native coronary artery without angina pectoris: Secondary | ICD-10-CM | POA: Diagnosis not present

## 2023-12-05 DIAGNOSIS — E785 Hyperlipidemia, unspecified: Secondary | ICD-10-CM | POA: Diagnosis not present

## 2023-12-05 DIAGNOSIS — I129 Hypertensive chronic kidney disease with stage 1 through stage 4 chronic kidney disease, or unspecified chronic kidney disease: Secondary | ICD-10-CM | POA: Diagnosis not present

## 2023-12-06 DIAGNOSIS — I10 Essential (primary) hypertension: Secondary | ICD-10-CM | POA: Diagnosis not present

## 2023-12-06 DIAGNOSIS — D649 Anemia, unspecified: Secondary | ICD-10-CM | POA: Diagnosis not present

## 2023-12-11 DIAGNOSIS — I7091 Generalized atherosclerosis: Secondary | ICD-10-CM | POA: Diagnosis not present

## 2023-12-11 DIAGNOSIS — B351 Tinea unguium: Secondary | ICD-10-CM | POA: Diagnosis not present

## 2023-12-18 DIAGNOSIS — F0284 Dementia in other diseases classified elsewhere, unspecified severity, with anxiety: Secondary | ICD-10-CM | POA: Diagnosis not present

## 2023-12-18 DIAGNOSIS — G301 Alzheimer's disease with late onset: Secondary | ICD-10-CM | POA: Diagnosis not present

## 2023-12-18 DIAGNOSIS — F419 Anxiety disorder, unspecified: Secondary | ICD-10-CM | POA: Diagnosis not present

## 2024-01-06 NOTE — Progress Notes (Unsigned)
 Cardiology Office Note:  .   Date:  01/07/2024  ID:  Christine Rich, DOB [birth year], MRN [id number] PCP: Eloisa Northern, MD  [facility] Providers Cardiologist:  Norman Herrlich, MD    History of Present Illness: .   Christine Rich is a 89 y.o. female with a past medical history of CAD s/p CABG x 5 in 2013, hypertension, PVCs, bradycardia, GERD, hyperlipidemia.  07/15/2019 Lexiscan however study, findings consistent with ischemia with a reversible defect 06/06/2019 echocardiogram EF 55 to 60%, normal diastolic function, mild aortic regurgitation, mild AAS without evidence of stenosis, trace MR 06/05/2019 monitor heart range 44 to 117 bpm with an average 69 bpm.  1 triggered event associated with PVCs, no pauses greater than 3 seconds, 1 episode of NSVT 9 complexes. 10/17/2016 left heart cath severe stenosis of the LAD, circumflex, CTO of RCA, successful PCI/DES of the ostial and mid SVG to the posterior descending coronary artery.  Evaluated by Dr. Dulce Sellar on 01/15/2023 for pedal edema, it was felt to be likely a side effect of Remeron however no changes were made to her medications or plan of care.  Evaluated on 07/12/2023 stable from a cardiac perspective, no changes were made to her plan of care she was advised to follow-up in 6 months.  She presents today for follow-up of her CAD accompanied by her son--however she request that he wait in the waiting room.  From a cardiac perspective, she has no formal complaints.  She is very tearful, has some cognitive impairment, now residing at [place], expresses depression but does not have any intentions for self-harm. She denies chest pain, palpitations, dyspnea, pnd, orthopnea, n, v, dizziness, syncope, edema, weight gain, or early satiety.   ROS: Review of Systems  Constitutional: Negative.   HENT: Negative.    Eyes: Negative.   Respiratory: Negative.    Cardiovascular: Negative.   Gastrointestinal: Negative.   Genitourinary: Negative.    Musculoskeletal: Negative.   Skin: Negative.   Neurological: Negative.   Endo/Heme/Allergies: Negative.   Psychiatric/Behavioral:  Positive for depression and memory loss.      Studies Reviewed: .        Cardiac Studies & Procedures   ______________________________________________________________________________________________   STRESS TESTS  MYOCARDIAL PERFUSION IMAGING 07/15/2019  Narrative  The left ventricular ejection fraction is hyperdynamic (>65%).  Nuclear stress EF: 70%.  There was no ST segment deviation noted during stress.  Defect 1: There is a medium defect of moderate severity present in the basal inferoseptal, basal inferior, mid inferoseptal and mid inferior location. This defect is reversible.  Findings consistent with ischemia.  This is a high risk study.      MONITORS  LONG TERM MONITOR (3-14 DAYS) 06/05/2019  Narrative A ZIO monitor was performed 6 days 23 hours beginning 05/20/2019 to assess symptomatic PVCs.  The rhythm throughout was sinus with minimum average and maximum heart rates of 44, 69 and 117 bpm.  The minimum rate was sinus bradycardia.  There was 1 single triggered event with frequent PVCs.  There were no pauses of 3 seconds or greater and no episodes of sinus node or AV nodal block.  Ventricular arrhythmia was occasional PVCs rare 181 couplets and rare triplets 21.  There was 1 episode of nonsustained ventricular tachycardia 9 complexes in a relatively slow rate of 139 bpm.  Supraventricular arrhythmia was rare with APCs.  There are no episodes of atrial fibrillation or flutter.  18 brief episodes of atrial tachycardia were present the longest 14  complexes at a rate of 96 bpm and the fastest 4 complexes at a rate of 148 bpm.   Conclusion overall  occasional ventricular and rare supraventricular arrhythmia with a single triggered event showing frequent PVCs and one 9 beat run of nonsustained ventricular tachycardia.        ______________________________________________________________________________________________      Risk Assessment/Calculations:            Physical Exam:   VS:  BP (!) 150/74   Pulse 75   Ht 5\' 1"  (1.549 m)   Wt 124 lb (56.2 kg)   SpO2 96%   BMI 23.43 kg/m    Wt Readings from Last 3 Encounters:  01/07/24 124 lb (56.2 kg)  07/12/23 119 lb 9.6 oz (54.3 kg)  04/16/23 113 lb 9.6 oz (51.5 kg)    GEN: Well nourished, well developed in no acute distress NECK: No JVD; No carotid bruits CARDIAC: RRR, no murmurs, rubs, gallops RESPIRATORY:  Clear to auscultation without rales, wheezing or rhonchi  ABDOMEN: Soft, non-tender, non-distended EXTREMITIES:  No edema; No deformity   ASSESSMENT AND PLAN: .   CAD/CABG x 5-she has episodes that sound to be consistent with stable angina, we previously increased her Imdur at one of her last office visits.  Is hard for her to decipher if this is working or not.  Continue aspirin 81 mg daily, continue Plavix 75 mg daily, continue Zetia 10 mg daily, continue Imdur 60 mg daily, continue metoprolol 25 mg daily, continue nitroglycerin as needed, continue Crestor 40 mg daily. Stable with no anginal symptoms. No indication for ischemic evaluation.    Hypertension-her blood pressure is elevated at 150/74 however she is very tearful and cries throughout her entire office visit today.  I do not think we need to make any medication changes but just continue to monitor, continue losartan 25 mg daily, continue metoprolol 25 mg daily.  Hyperlipidemia-most recent LDL was elevated at 152, some question as to if she was getting this at her facility or not. She does not want lab work today and she is very tearful.  Recommended continue Crestor 40 mg daily, Zetia 10 mg daily.  PVCs-quiescent.  Continue metoprolol 25 mg daily.  Depression/memory impairment-she is very tearful, currently on Remeron as well as Xanax, resides at [facility].       Dispo: Follow-up in  6 months.  Signed, Flossie Dibble, NP

## 2024-01-07 ENCOUNTER — Ambulatory Visit: Attending: Cardiology | Admitting: Cardiology

## 2024-01-07 ENCOUNTER — Encounter: Payer: Self-pay | Admitting: Cardiology

## 2024-01-07 VITALS — BP 150/74 | HR 75 | Ht 61.0 in | Wt 124.0 lb

## 2024-01-07 DIAGNOSIS — F0390 Unspecified dementia without behavioral disturbance: Secondary | ICD-10-CM | POA: Diagnosis not present

## 2024-01-07 DIAGNOSIS — I493 Ventricular premature depolarization: Secondary | ICD-10-CM | POA: Diagnosis not present

## 2024-01-07 DIAGNOSIS — F32A Depression, unspecified: Secondary | ICD-10-CM

## 2024-01-07 DIAGNOSIS — E782 Mixed hyperlipidemia: Secondary | ICD-10-CM

## 2024-01-07 DIAGNOSIS — I2510 Atherosclerotic heart disease of native coronary artery without angina pectoris: Secondary | ICD-10-CM

## 2024-01-07 DIAGNOSIS — I10 Essential (primary) hypertension: Secondary | ICD-10-CM | POA: Diagnosis not present

## 2024-01-07 NOTE — Patient Instructions (Signed)
 Medication Instructions:  Your physician recommends that you continue on your current medications as directed. Please refer to the Current Medication list given to you today.  *If you need a refill on your cardiac medications before your next appointment, please call your pharmacy*   Lab Work: None Ordered If you have labs (blood work) drawn today and your tests are completely normal, you will receive your results only by: MyChart Message (if you have MyChart) OR A paper copy in the mail If you have any lab test that is abnormal or we need to change your treatment, we will call you to review the results.   Testing/Procedures: None Ordered   Follow-Up: At [facility], you and your health needs are our priority.  As part of our continuing mission to provide you with exceptional heart care, we have created designated Provider Care Teams.  These Care Teams include your primary Cardiologist (physician) and Advanced Practice Providers (APPs -  Physician Assistants and Nurse Practitioners) who all work together to provide you with the care you need, when you need it.  We recommend signing up for the patient portal called "MyChart".  Sign up information is provided on this After Visit Summary.  MyChart is used to connect with patients for Virtual Visits (Telemedicine).  Patients are able to view lab/test results, encounter notes, upcoming appointments, etc.  Non-urgent messages can be sent to your provider as well.   To learn more about what you can do with MyChart, go to [email or web address].

## 2024-01-08 ENCOUNTER — Ambulatory Visit: Payer: Medicare PPO | Admitting: Cardiology

## 2024-01-15 DIAGNOSIS — F5105 Insomnia due to other mental disorder: Secondary | ICD-10-CM | POA: Diagnosis not present

## 2024-01-15 DIAGNOSIS — G301 Alzheimer's disease with late onset: Secondary | ICD-10-CM | POA: Diagnosis not present

## 2024-01-15 DIAGNOSIS — F0284 Dementia in other diseases classified elsewhere, unspecified severity, with anxiety: Secondary | ICD-10-CM | POA: Diagnosis not present

## 2024-01-15 DIAGNOSIS — F419 Anxiety disorder, unspecified: Secondary | ICD-10-CM | POA: Diagnosis not present

## 2024-01-18 DIAGNOSIS — F0390 Unspecified dementia without behavioral disturbance: Secondary | ICD-10-CM | POA: Diagnosis not present

## 2024-01-18 DIAGNOSIS — L84 Corns and callosities: Secondary | ICD-10-CM | POA: Diagnosis not present

## 2024-01-18 DIAGNOSIS — R52 Pain, unspecified: Secondary | ICD-10-CM | POA: Diagnosis not present

## 2024-01-18 DIAGNOSIS — M6281 Muscle weakness (generalized): Secondary | ICD-10-CM | POA: Diagnosis not present

## 2024-01-29 DIAGNOSIS — G301 Alzheimer's disease with late onset: Secondary | ICD-10-CM | POA: Diagnosis not present

## 2024-01-29 DIAGNOSIS — F419 Anxiety disorder, unspecified: Secondary | ICD-10-CM | POA: Diagnosis not present

## 2024-01-29 DIAGNOSIS — F0284 Dementia in other diseases classified elsewhere, unspecified severity, with anxiety: Secondary | ICD-10-CM | POA: Diagnosis not present

## 2024-01-29 DIAGNOSIS — F5105 Insomnia due to other mental disorder: Secondary | ICD-10-CM | POA: Diagnosis not present

## 2024-02-07 ENCOUNTER — Ambulatory Visit: Admitting: Podiatry

## 2024-02-07 DIAGNOSIS — I739 Peripheral vascular disease, unspecified: Secondary | ICD-10-CM

## 2024-02-07 DIAGNOSIS — M21611 Bunion of right foot: Secondary | ICD-10-CM

## 2024-02-07 DIAGNOSIS — B351 Tinea unguium: Secondary | ICD-10-CM

## 2024-02-07 DIAGNOSIS — L84 Corns and callosities: Secondary | ICD-10-CM | POA: Diagnosis not present

## 2024-02-07 NOTE — Progress Notes (Signed)
 Subjective:  Patient ID: Christine Rich, female    DOB: 1934/01/26,  MRN: [id number]  Christine Rich presents to clinic today for:  Chief Complaint  Patient presents with   Callouses    NP here to establish RFC but the main reason is the callous on the right 2nd toe. She also has bunions but did not state any problem with it. Christine Rich, her son, is with her today. She does seem to have some memory issues. Not diabetic and takes Plavix .  ABN signed.   Patient notes nails are thick and elongated, causing pain in shoe gear when ambulating.  Her son was with her today but stepped out for the exam.  Patient has dementia.  She has painful corns and calluses bilateral.  She is wearing narrow dress shoes which are not accommodating to her foot size today.  PCP is Christine Form, MD. last seen 12/05/2023  Past Medical History:  Diagnosis Date   Abnormal CXR 06/13/2019   Accelerated hypertension 03/15/2022   Acute bronchitis 03/15/2022   Angina at rest [facility]) 03/15/2022   Anxiety 03/15/2022   Anxiety and depression 03/15/2022   Last Assessment & Plan:  Formatting of this note might be different from the original. - not on meds at home, clinically very anxious, noted received buspirone 5mg  x1 at midnight  - support provided   Boxers fracture 03/15/2022   CAD (coronary artery disease) of artery bypass graft 03/15/2022   Chest pain 06/06/2019   Last Assessment & Plan:  Formatting of this note might be different from the original. - intermittent chest pain over the past 1-2 weeks, currently pain free   - will check 2nd trop, CK today - EKG non-specific, start telemetry monitor  - cardiology consult pending  - Echo pending   Coronary artery disease involving native coronary artery of native heart 11/17/2015   Overview:  s/p CABGSx5 in 2003Lexiscan MPS in Aug 2011 without ischemia, EF 65% Cardiolite 10/29/12: CONCLUSIONS:  Study quality: fair, with obvious inferior attenuation artifact reducing the sensitivity of the  test  (1) diaphragm attenuation artifact - prior non-transmural infarction cannot be excluded  (2) normal LV size, with ? inferior hypokinesis - measured LVEF of 48% may be an underestimate  (3) no areas of (vasodilator) stress-induced hypoperfusion are identified.  Negative perfusion stress test for potenital ischemia.   Cardiac catheterization January 2018 drug-eluting stent to graft going to the posterior descending artery   Cough 06/13/2019   Cyst of skin and subcutaneous tissue 10/12/2020   Dyspnea on exertion 03/15/2022   Essential hypertension 11/17/2015   GERD (gastroesophageal reflux disease) 03/15/2022   Hx of CABG 03/15/2022   Hyperlipemia 11/17/2015   Hyperlipidemia 03/15/2022   Left against medical advice 03/15/2022   Malignant neoplasm of left female breast (HCC) 01/23/2018   Neck mass 10/11/2020   Nontoxic multinodular goiter 08/14/2016   NSTEMI (non-ST elevated myocardial infarction) (HCC) 10/18/2016   Orthopnea 03/15/2022   Pancreatic cyst 03/15/2022   Paresthesia 11/27/2019   Posterior neck pain 01/23/2018   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 - cough, fatigue, weakness for 1-2 weeks - nonspecific symptoms, will r/o COVID-19   Thyroid  mass 11/30/2020   Vitamin B 12 deficiency  Weakness 06/06/2019   Last Assessment & Plan:  Formatting of this note might be different from the original. - TSH WNL - B12 low, replacement as below  - r/o ACS, COVID-19, likely due to age and debility , PT to see    No Known Allergies  Objective:  Christine Rich is a pleasant 89 y.o. female in NAD. AAO x 3.  Vascular Examination: Patient has palpable DP pulse, absent PT pulse bilateral.  Delayed capillary refill  bilateral toes.  Sparse digital hair bilateral.  Proximal to distal cooling WNL bilateral.    Dermatological Examination: Interspaces are clear with no open lesions noted bilateral.  Skin is shiny and atrophic bilateral.  Nails are 3-72mm thick, with yellowish/brown discoloration, subungual debris and distal onycholysis x10.  There is pain with compression of nails x10.  There are hyperkeratotic lesions noted on the medial right bunion, dorsal lateral aspect right fifth toe PIPJ, lateral right hallux IPJ, and plantar right second toe near the sulcus..  Neurological Examination: Epicritic sensation intact  Musculoskeletal Examination: Moderate bony prominence on the medial aspect of the first MPJ on the right foot with lateral angulation of the hallux.  Much less severe on the left.  Patient qualifies for at-risk foot care because of PVD.  Assessment/Plan: 1. Pain due to onychomycosis of toenails of both feet   2. PVD (peripheral vascular disease) (HCC)   3. Corns    Mycotic nails x10 were sharply debrided with sterile nail nippers and power debriding burr to decrease bulk and length.  Hyperkeratotic lesions x 4 were shaved with #312 blade.  3 of the lesions were proximal to the toe DIPJ's.  Lambswool was dispensed and wrapped around the second toe to create some space between the 1st and 2nd toes and decrease chance of recurrence of the painful corn on the lateral aspect of the right hallux.  There is stiffness to the great toe joint so any thick spacers may be too painful for the patient and actually cut into the skin between the toes.  Even though the patient has a wider right foot than the left due to the bunion deformity, I recommend wider shoes such as sketchers go walk shoes to accommodate the bunion on the right and decreased lateral pressure on the hallux.  Her current shoes are well-fitting and are aggravating the issue.   Return in about 3 months (around 05/08/2024) for  RFC.   Joe Murders, DPM, FACFAS [facility]     [address] [place], [place] [address]                Office [phone]  Fax [phone]

## 2024-03-04 DIAGNOSIS — F5105 Insomnia due to other mental disorder: Secondary | ICD-10-CM | POA: Diagnosis not present

## 2024-03-04 DIAGNOSIS — F0284 Dementia in other diseases classified elsewhere, unspecified severity, with anxiety: Secondary | ICD-10-CM | POA: Diagnosis not present

## 2024-03-04 DIAGNOSIS — G301 Alzheimer's disease with late onset: Secondary | ICD-10-CM | POA: Diagnosis not present

## 2024-03-04 DIAGNOSIS — F419 Anxiety disorder, unspecified: Secondary | ICD-10-CM | POA: Diagnosis not present

## 2024-04-07 DIAGNOSIS — F4323 Adjustment disorder with mixed anxiety and depressed mood: Secondary | ICD-10-CM | POA: Diagnosis not present

## 2024-04-15 DIAGNOSIS — F5104 Psychophysiologic insomnia: Secondary | ICD-10-CM | POA: Diagnosis not present

## 2024-04-15 DIAGNOSIS — F419 Anxiety disorder, unspecified: Secondary | ICD-10-CM | POA: Diagnosis not present

## 2024-04-15 DIAGNOSIS — F32A Depression, unspecified: Secondary | ICD-10-CM | POA: Diagnosis not present

## 2024-04-15 DIAGNOSIS — F0284 Dementia in other diseases classified elsewhere, unspecified severity, with anxiety: Secondary | ICD-10-CM | POA: Diagnosis not present

## 2024-04-15 DIAGNOSIS — G301 Alzheimer's disease with late onset: Secondary | ICD-10-CM | POA: Diagnosis not present

## 2024-04-15 DIAGNOSIS — F0283 Dementia in other diseases classified elsewhere, unspecified severity, with mood disturbance: Secondary | ICD-10-CM | POA: Diagnosis not present

## 2024-04-16 DIAGNOSIS — E785 Hyperlipidemia, unspecified: Secondary | ICD-10-CM | POA: Diagnosis not present

## 2024-04-16 DIAGNOSIS — M1990 Unspecified osteoarthritis, unspecified site: Secondary | ICD-10-CM | POA: Diagnosis not present

## 2024-04-16 DIAGNOSIS — F0390 Unspecified dementia without behavioral disturbance: Secondary | ICD-10-CM | POA: Diagnosis not present

## 2024-04-16 DIAGNOSIS — F330 Major depressive disorder, recurrent, mild: Secondary | ICD-10-CM

## 2024-04-16 DIAGNOSIS — F419 Anxiety disorder, unspecified: Secondary | ICD-10-CM | POA: Diagnosis not present

## 2024-04-16 DIAGNOSIS — I129 Hypertensive chronic kidney disease with stage 1 through stage 4 chronic kidney disease, or unspecified chronic kidney disease: Secondary | ICD-10-CM | POA: Diagnosis not present

## 2024-04-16 DIAGNOSIS — I2510 Atherosclerotic heart disease of native coronary artery without angina pectoris: Secondary | ICD-10-CM | POA: Diagnosis not present

## 2024-04-16 DIAGNOSIS — N1831 Chronic kidney disease, stage 3a: Secondary | ICD-10-CM | POA: Diagnosis not present

## 2024-04-16 DIAGNOSIS — M6281 Muscle weakness (generalized): Secondary | ICD-10-CM | POA: Diagnosis not present

## 2024-04-16 DIAGNOSIS — I700 Atherosclerosis of aorta: Secondary | ICD-10-CM | POA: Diagnosis not present

## 2024-04-17 DIAGNOSIS — E119 Type 2 diabetes mellitus without complications: Secondary | ICD-10-CM | POA: Diagnosis not present

## 2024-04-17 DIAGNOSIS — E559 Vitamin D deficiency, unspecified: Secondary | ICD-10-CM | POA: Diagnosis not present

## 2024-04-17 DIAGNOSIS — D649 Anemia, unspecified: Secondary | ICD-10-CM | POA: Diagnosis not present

## 2024-04-17 DIAGNOSIS — I10 Essential (primary) hypertension: Secondary | ICD-10-CM | POA: Diagnosis not present

## 2024-04-17 DIAGNOSIS — D519 Vitamin B12 deficiency anemia, unspecified: Secondary | ICD-10-CM | POA: Diagnosis not present

## 2024-04-17 DIAGNOSIS — E030 Congenital hypothyroidism with diffuse goiter: Secondary | ICD-10-CM | POA: Diagnosis not present

## 2024-04-17 DIAGNOSIS — E785 Hyperlipidemia, unspecified: Secondary | ICD-10-CM | POA: Diagnosis not present

## 2024-04-17 DIAGNOSIS — E874 Mixed disorder of acid-base balance: Secondary | ICD-10-CM | POA: Diagnosis not present

## 2024-05-05 DIAGNOSIS — B351 Tinea unguium: Secondary | ICD-10-CM | POA: Diagnosis not present

## 2024-05-05 DIAGNOSIS — I7091 Generalized atherosclerosis: Secondary | ICD-10-CM | POA: Diagnosis not present

## 2024-05-15 ENCOUNTER — Ambulatory Visit: Admitting: Podiatry

## 2024-05-15 DIAGNOSIS — B351 Tinea unguium: Secondary | ICD-10-CM

## 2024-05-15 DIAGNOSIS — M79674 Pain in right toe(s): Secondary | ICD-10-CM | POA: Diagnosis not present

## 2024-05-15 DIAGNOSIS — I739 Peripheral vascular disease, unspecified: Secondary | ICD-10-CM | POA: Diagnosis not present

## 2024-05-15 DIAGNOSIS — M79675 Pain in left toe(s): Secondary | ICD-10-CM

## 2024-05-15 DIAGNOSIS — L84 Corns and callosities: Secondary | ICD-10-CM

## 2024-05-15 NOTE — Progress Notes (Signed)
 Subjective:  Patient ID: Christine Rich, female    DOB: [birth year],  MRN: [id number]  Christine Rich presents to clinic today for:  Chief Complaint  Patient presents with   [facility]    RFC with callous on the right 2nd toe, there are 2. On is on the pad of the toe and the other is interdigital. Not diabetic and takes ASA   Patient notes nails are thick and elongated, causing pain in shoe gear when ambulating.    She has painful corns and calluses bilateral.    PCP is Caleen Dirks, MD. last seen 01/18/2024.  It does appear that the patient saw the PCP on this date for corns and calluses.  Unsure if they billed for any debridement as this would then make our visit not covered by insurance due to required timeframe between at risk footcare appointments.  Past Medical History:  Diagnosis Date   Abnormal CXR 06/13/2019   Accelerated hypertension 03/15/2022   Acute bronchitis 03/15/2022   Angina at rest [facility]) 03/15/2022   Anxiety 03/15/2022   Anxiety and depression 03/15/2022   Last Assessment & Plan:  Formatting of this note might be different from the original. - not on meds at home, clinically very anxious, noted received buspirone 5mg  x1 at midnight  - support provided   Boxers fracture 03/15/2022   CAD (coronary artery disease) of artery bypass graft 03/15/2022   Chest pain 06/06/2019   Last Assessment & Plan:  Formatting of this note might be different from the original. - intermittent chest pain over the past 1-2 weeks, currently pain free   - will check 2nd trop, CK today - EKG non-specific, start telemetry monitor  - cardiology consult pending  - Echo pending   Coronary artery disease involving native coronary artery of native heart 11/17/2015   Overview:  s/p CABGSx5 in 2003Lexiscan MPS in Aug 2011 without ischemia, EF 65% Cardiolite 10/29/12: CONCLUSIONS:  Study quality: fair, with obvious inferior attenuation artifact reducing the sensitivity of the test  (1) diaphragm attenuation artifact - prior  non-transmural infarction cannot be excluded  (2) normal LV size, with ? inferior hypokinesis - measured LVEF of 48% may be an underestimate  (3) no areas of (vasodilator) stress-induced hypoperfusion are identified.  Negative perfusion stress test for potenital ischemia.   Cardiac catheterization January 2018 drug-eluting stent to graft going to the posterior descending artery   Cough 06/13/2019   Cyst of skin and subcutaneous tissue 10/12/2020   Dyspnea on exertion 03/15/2022   Essential hypertension 11/17/2015   GERD (gastroesophageal reflux disease) 03/15/2022   Hx of CABG 03/15/2022   Hyperlipemia 11/17/2015   Hyperlipidemia 03/15/2022   Left against medical advice 03/15/2022   Malignant neoplasm of left female breast (HCC) 01/23/2018   Neck mass 10/11/2020   Nontoxic multinodular goiter 08/14/2016   NSTEMI (non-ST elevated myocardial infarction) (HCC) 10/18/2016   Orthopnea 03/15/2022   Pancreatic cyst 03/15/2022   Paresthesia 11/27/2019   Posterior neck pain 01/23/2018   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 - cough, fatigue, weakness for 1-2 weeks - nonspecific symptoms, will r/o COVID-19   Thyroid  mass 11/30/2020   Vitamin B 12 deficiency    Weakness 06/06/2019   Last Assessment &  Plan:  Formatting of this note might be different from the original. - TSH WNL - B12 low, replacement as below  - r/o ACS, COVID-19, likely due to age and debility , PT to see    No Known Allergies  Objective:  Christine Rich is a pleasant 89 y.o. female in NAD. AAO x 3.  Vascular Examination: Patient has palpable DP pulse, absent PT pulse bilateral.  Delayed capillary refill bilateral toes.  Sparse digital hair bilateral.   Proximal to distal cooling WNL bilateral.    Dermatological Examination: Interspaces are clear with no open lesions noted bilateral.  Skin is shiny and atrophic bilateral.  Nails are 3-14mm thick, with yellowish/brown discoloration, subungual debris and distal onycholysis x10.  There is pain with compression of nails x10.  There are hyperkeratotic lesions noted on the dorsolateral aspect bilateral fifth toe PIPJ, and plantar right 3rd toe.  Patient qualifies for at-risk foot care because of PVD.  Assessment/Plan: 1. Pain due to onychomycosis of toenails of both feet   2. Corns   3. PVD (peripheral vascular disease) (HCC)    Mycotic nails x10 were sharply debrided with sterile nail nippers and power debriding burr to decrease bulk and length.  Hyperkeratotic lesions x 3 were shaved with #312 blade.  Two of the lesions were proximal to the toe DIPJ's.  Return in about 3 months (around 08/15/2024) for RFC.   Awanda CHARM Imperial, DPM, FACFAS [facility]     [address] [place], [place] [address]                Office [phone]  Fax [phone]

## 2024-05-20 DIAGNOSIS — F5104 Psychophysiologic insomnia: Secondary | ICD-10-CM | POA: Diagnosis not present

## 2024-05-20 DIAGNOSIS — F32A Depression, unspecified: Secondary | ICD-10-CM | POA: Diagnosis not present

## 2024-05-20 DIAGNOSIS — G301 Alzheimer's disease with late onset: Secondary | ICD-10-CM | POA: Diagnosis not present

## 2024-05-20 DIAGNOSIS — F419 Anxiety disorder, unspecified: Secondary | ICD-10-CM | POA: Diagnosis not present

## 2024-05-20 DIAGNOSIS — F0283 Dementia in other diseases classified elsewhere, unspecified severity, with mood disturbance: Secondary | ICD-10-CM | POA: Diagnosis not present

## 2024-05-20 DIAGNOSIS — F0284 Dementia in other diseases classified elsewhere, unspecified severity, with anxiety: Secondary | ICD-10-CM | POA: Diagnosis not present

## 2024-06-06 DIAGNOSIS — F4323 Adjustment disorder with mixed anxiety and depressed mood: Secondary | ICD-10-CM | POA: Diagnosis not present

## 2024-06-17 DIAGNOSIS — F411 Generalized anxiety disorder: Secondary | ICD-10-CM | POA: Diagnosis not present

## 2024-06-17 DIAGNOSIS — F32A Depression, unspecified: Secondary | ICD-10-CM | POA: Diagnosis not present

## 2024-06-17 DIAGNOSIS — E559 Vitamin D deficiency, unspecified: Secondary | ICD-10-CM | POA: Diagnosis not present

## 2024-06-17 DIAGNOSIS — G309 Alzheimer's disease, unspecified: Secondary | ICD-10-CM | POA: Diagnosis not present

## 2024-06-17 DIAGNOSIS — F0284 Dementia in other diseases classified elsewhere, unspecified severity, with anxiety: Secondary | ICD-10-CM | POA: Diagnosis not present

## 2024-06-17 DIAGNOSIS — F5105 Insomnia due to other mental disorder: Secondary | ICD-10-CM | POA: Diagnosis not present

## 2024-06-17 DIAGNOSIS — F0283 Dementia in other diseases classified elsewhere, unspecified severity, with mood disturbance: Secondary | ICD-10-CM | POA: Diagnosis not present

## 2024-06-17 DIAGNOSIS — F99 Mental disorder, not otherwise specified: Secondary | ICD-10-CM | POA: Diagnosis not present

## 2024-06-19 DIAGNOSIS — E559 Vitamin D deficiency, unspecified: Secondary | ICD-10-CM | POA: Diagnosis not present

## 2024-06-19 DIAGNOSIS — F0390 Unspecified dementia without behavioral disturbance: Secondary | ICD-10-CM | POA: Diagnosis not present

## 2024-06-30 DIAGNOSIS — F4323 Adjustment disorder with mixed anxiety and depressed mood: Secondary | ICD-10-CM | POA: Diagnosis not present

## 2024-07-03 DIAGNOSIS — F0390 Unspecified dementia without behavioral disturbance: Secondary | ICD-10-CM | POA: Diagnosis not present

## 2024-07-03 DIAGNOSIS — E785 Hyperlipidemia, unspecified: Secondary | ICD-10-CM | POA: Diagnosis not present

## 2024-07-04 DIAGNOSIS — Z23 Encounter for immunization: Secondary | ICD-10-CM | POA: Diagnosis not present

## 2024-07-18 DIAGNOSIS — L84 Corns and callosities: Secondary | ICD-10-CM | POA: Diagnosis not present

## 2024-07-18 DIAGNOSIS — F0390 Unspecified dementia without behavioral disturbance: Secondary | ICD-10-CM | POA: Diagnosis not present

## 2024-07-18 DIAGNOSIS — M6281 Muscle weakness (generalized): Secondary | ICD-10-CM | POA: Diagnosis not present

## 2024-07-18 DIAGNOSIS — R52 Pain, unspecified: Secondary | ICD-10-CM | POA: Diagnosis not present

## 2024-07-22 DIAGNOSIS — F411 Generalized anxiety disorder: Secondary | ICD-10-CM | POA: Diagnosis not present

## 2024-07-22 DIAGNOSIS — G309 Alzheimer's disease, unspecified: Secondary | ICD-10-CM | POA: Diagnosis not present

## 2024-07-22 DIAGNOSIS — F5105 Insomnia due to other mental disorder: Secondary | ICD-10-CM | POA: Diagnosis not present

## 2024-07-22 DIAGNOSIS — F32A Depression, unspecified: Secondary | ICD-10-CM | POA: Diagnosis not present

## 2024-07-22 DIAGNOSIS — F99 Mental disorder, not otherwise specified: Secondary | ICD-10-CM | POA: Diagnosis not present

## 2024-07-22 DIAGNOSIS — F0283 Dementia in other diseases classified elsewhere, unspecified severity, with mood disturbance: Secondary | ICD-10-CM | POA: Diagnosis not present

## 2024-07-22 DIAGNOSIS — F0284 Dementia in other diseases classified elsewhere, unspecified severity, with anxiety: Secondary | ICD-10-CM | POA: Diagnosis not present

## 2024-07-29 NOTE — Progress Notes (Unsigned)
 Cardiology Office Note:    Date:  07/30/2024   ID:  Christine Rich, DOB October 15, 1934, MRN [id number]  PCP:  Caleen Dirks, MD  Cardiologist:  Redell Leiter, MD    Referring MD: Caleen Dirks, MD    ASSESSMENT:    1. Coronary artery disease involving native coronary artery of native heart, unspecified whether angina present   2. Essential hypertension   3. Mixed hyperlipidemia   4. PVCs (premature ventricular contractions)   5. Dementia, unspecified dementia severity, unspecified dementia type, unspecified whether behavioral, psychotic, or mood disturbance or anxiety (HCC)    PLAN:    In order of problems listed above:  Fortunately doing well stable CAD no anginal discomfort reported she will continue her dual antiplatelet therapy aspirin clopidogrel  oral nitrate beta-blocker and high intensity statin Stable blood pressure continue current treatment ARB Recheck her lipid profile would be quite unusual with an LDL of 159 on a high dose of the high intensity statin Stable PVCs none on today's EKG Dementia looks much improved she is much happier and is not agitated today   Next appointment: 9 months   Medication Adjustments/Labs and Tests Ordered: Current medicines are reviewed at length with the patient today.  Concerns regarding medicines are outlined above.  Orders Placed This Encounter  Procedures   EKG 12-Lead   No orders of the defined types were placed in this encounter.    History of Present Illness:    Christine Rich is a 89 y.o. female with a hx of CAD with CABG 2013 subsequent PCI and stent vein graft to posterior descending artery 2019 hypertension dyslipidemia frequent PVCs and sadly most recently her care has been predominantly directed with dementia.  Last seen 07/12/2023.  Compliance with diet, lifestyle and medications: Yes  Today she is in good spirits not agitated and tells me she feels well and is not having any heart trouble No reported chest pain edema  shortness of breath palpitation or syncope Her last labs had an LDL of 159 which I find hard to except if she is taking a high intensity statin I am going to go ahead and recheck a lipid profile and a CMP Her EKG is unchanged from previous right bundle branch block ST-T changes Past Medical History:  Diagnosis Date   Abnormal CXR 06/13/2019   Accelerated hypertension 03/15/2022   Acute bronchitis 03/15/2022   Angina at rest 03/15/2022   Anxiety 03/15/2022   Anxiety and depression 03/15/2022   Last Assessment & Plan:  Formatting of this note might be different from the original. - not on meds at home, clinically very anxious, noted received buspirone 5mg  x1 at midnight  - support provided   Boxers fracture 03/15/2022   CAD (coronary artery disease) of artery bypass graft 03/15/2022   Chest pain 06/06/2019   Last Assessment & Plan:  Formatting of this note might be different from the original. - intermittent chest pain over the past 1-2 weeks, currently pain free   - will check 2nd trop, CK today - EKG non-specific, start telemetry monitor  - cardiology consult pending  - Echo pending   Coronary artery disease involving native coronary artery of native heart 11/17/2015   Overview:  s/p CABGSx5 in 2003Lexiscan MPS in Aug 2011 without ischemia, EF 65% Cardiolite 10/29/12: CONCLUSIONS:  Study quality: fair, with obvious inferior attenuation artifact reducing the sensitivity of the test  (1) diaphragm attenuation artifact - prior non-transmural infarction cannot be excluded  (2) normal LV size, with ?  inferior hypokinesis - measured LVEF of 48% may be an underestimate  (3) no areas of (vasodilator) stress-induced hypoperfusion are identified.  Negative perfusion stress test for potenital ischemia.   Cardiac catheterization January 2018 drug-eluting stent to graft going to the posterior descending artery   Cough 06/13/2019   Cyst of skin and subcutaneous tissue 10/12/2020   Dyspnea on exertion 03/15/2022    Essential hypertension 11/17/2015   GERD (gastroesophageal reflux disease) 03/15/2022   Hx of CABG 03/15/2022   Hyperlipemia 11/17/2015   Hyperlipidemia 03/15/2022   Left against medical advice 03/15/2022   Malignant neoplasm of left female breast (HCC) 01/23/2018   Neck mass 10/11/2020   Nontoxic multinodular goiter 08/14/2016   NSTEMI (non-ST elevated myocardial infarction) (HCC) 10/18/2016   Orthopnea 03/15/2022   Pancreatic cyst 03/15/2022   Paresthesia 11/27/2019   Posterior neck pain 01/23/2018   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 - cough, fatigue, weakness for 1-2 weeks - nonspecific symptoms, will r/o COVID-19   Thyroid  mass 11/30/2020   Vitamin B 12 deficiency    Weakness 06/06/2019   Last Assessment & Plan:  Formatting of this note might be different from the original. - TSH WNL - B12 low, replacement as below  - r/o ACS, COVID-19, likely due to age and debility , PT to see    Current Medications: Current Meds  Medication Sig   aspirin EC 81 MG tablet Take 81 mg by mouth daily.   cetirizine (ZYRTEC) 10 MG tablet Take 1 tablet by mouth daily.   cloNIDine (CATAPRES) 0.1 MG tablet Take 0.1 mg by mouth daily.   clopidogrel  (PLAVIX ) 75 MG tablet Take 1 tablet (75 mg total) by mouth daily.   cyanocobalamin  1000 MCG tablet Take 1,000 mcg by mouth daily.   ezetimibe  (ZETIA ) 10 MG tablet Take 1 tablet (10 mg total) by mouth daily.   ipratropium (ATROVENT ) 0.06 % nasal spray Use 2 sprays in each nostril twice daily   isosorbide  mononitrate (IMDUR ) 60 MG 24 hr tablet Take 1 tablet (60 mg total) by mouth daily.   losartan  (COZAAR ) 25 MG tablet Take 1 tablet (25 mg total) by mouth daily.   metoprolol  succinate  (TOPROL -XL) 25 MG 24 hr tablet Take 25 mg by mouth daily.   mirtazapine (REMERON) 15 MG tablet Take 15 mg by mouth at bedtime.   Multiple Vitamins-Minerals (PRESERVISION AREDS 2 PO) Take 1 tablet by mouth daily.   nitroGLYCERIN  (NITROSTAT ) 0.4 MG SL tablet Place 1 tablet (0.4 mg total) under the tongue every 5 (five) minutes as needed for chest pain.   rosuvastatin  (CRESTOR ) 40 MG tablet TAKE 1 TABLET BY MOUTH EVERY DAY   traMADol (ULTRAM) 50 MG tablet Take by mouth every 6 (six) hours as needed.      EKGs/Labs/Other Studies Reviewed:    The following studies were reviewed today:  Cardiac Studies & Procedures   ______________________________________________________________________________________________   STRESS TESTS  MYOCARDIAL PERFUSION IMAGING 07/15/2019  Interpretation Summary  The left ventricular ejection fraction is hyperdynamic (>65%).  Nuclear stress EF: 70%.  There was no ST segment deviation noted during stress.  Defect 1: There is a medium defect of moderate severity present in the basal inferoseptal, basal inferior, mid inferoseptal and mid inferior location. This  defect is reversible.  Findings consistent with ischemia.  This is a high risk study.      MONITORS  LONG TERM MONITOR (3-14 DAYS) 06/05/2019  Narrative A ZIO monitor was performed 6 days 23 hours beginning 05/20/2019 to assess symptomatic PVCs.  The rhythm throughout was sinus with minimum average and maximum heart rates of 44, 69 and 117 bpm.  The minimum rate was sinus bradycardia.  There was 1 single triggered event with frequent PVCs.  There were no pauses of 3 seconds or greater and no episodes of sinus node or AV nodal block.  Ventricular arrhythmia was occasional PVCs rare 181 couplets and rare triplets 21.  There was 1 episode of nonsustained ventricular tachycardia 9 complexes in a relatively slow rate of 139 bpm.  Supraventricular arrhythmia was rare with APCs.  There are no  episodes of atrial fibrillation or flutter.  18 brief episodes of atrial tachycardia were present the longest 14 complexes at a rate of 96 bpm and the fastest 4 complexes at a rate of 148 bpm.   Conclusion overall  occasional ventricular and rare supraventricular arrhythmia with a single triggered event showing frequent PVCs and one 9 beat run of nonsustained ventricular tachycardia.       ______________________________________________________________________________________________      EKG Interpretation Date/Time:  Wednesday July 30 2024 15:44:02 EDT Ventricular Rate:  69 PR Interval:  282 QRS Duration:  130 QT Interval:  426 QTC Calculation: 456 R Axis:   79  Text Interpretation: Sinus rhythm First degree A-V block 1 APC Right bundle branch block Septal infarct (cited on or before 30-Jul-2024) T wave abnormality, consider inferior ischemia When compared with ECG of 12-Jul-2023 10:19, No significant change since last tracing Confirmed by Monetta Rogue ([id number]) on 07/30/2024 3:56:26 PM  EKG Interpretation Date/Time:  Wednesday July 30 2024 15:44:02 EDT Ventricular Rate:  69 PR Interval:  282 QRS Duration:  130 QT Interval:  426 QTC Calculation: 456 R Axis:   79  Text Interpretation: Sinus rhythm First degree A-V block 1 APC Right bundle branch block Septal infarct (cited on or before 30-Jul-2024) T wave abnormality, consider inferior ischemia When compared with ECG of 12-Jul-2023 10:19, No significant change since last tracing Confirmed by Monetta Rogue ([id number]) on 07/30/2024 3:56:26 PM      Component Value Date/Time   CHOL 238 (H) 07/12/2023 1053   TRIG 134 07/12/2023 1053   HDL 55 07/12/2023 1053   CHOLHDL 4.3 07/12/2023 1053   LDLCALC 159 (H) 07/12/2023 1053    Physical Exam:    VS:  BP 114/64   Pulse 69   Ht 5' 1 (1.549 m)   Wt 128 lb 6.4 oz (58.2 kg)   SpO2 98%   BMI 24.26 kg/m     Wt Readings from Last 3 Encounters:  07/30/24 128 lb 6.4 oz (58.2 kg)   01/07/24 124 lb (56.2 kg)  07/12/23 119 lb 9.6 oz (54.3 kg)     GEN: Today she is quite pleasant not agitated well nourished, well developed in no acute distress HEENT: Normal NECK: No JVD; No carotid bruits LYMPHATICS: No lymphadenopathy CARDIAC: 1/6 systolic ejection murmur does not encompass S2 carotids are normal RRR, no murmurs, rubs, gallops RESPIRATORY:  Clear to auscultation without rales, wheezing or rhonchi  ABDOMEN: Soft, non-tender, non-distended MUSCULOSKELETAL:  No edema; No deformity  SKIN: Warm and dry NEUROLOGIC:  Alert and oriented x 3 PSYCHIATRIC:  Normal affect    Signed, Rogue Monetta, MD  07/30/2024 4:01 PM  [facility]

## 2024-07-30 ENCOUNTER — Encounter: Payer: Self-pay | Admitting: Cardiology

## 2024-07-30 ENCOUNTER — Ambulatory Visit: Attending: Cardiology | Admitting: Cardiology

## 2024-07-30 VITALS — BP 114/64 | HR 69 | Ht 61.0 in | Wt 128.4 lb

## 2024-07-30 DIAGNOSIS — I493 Ventricular premature depolarization: Secondary | ICD-10-CM

## 2024-07-30 DIAGNOSIS — E782 Mixed hyperlipidemia: Secondary | ICD-10-CM | POA: Diagnosis not present

## 2024-07-30 DIAGNOSIS — I10 Essential (primary) hypertension: Secondary | ICD-10-CM

## 2024-07-30 DIAGNOSIS — F0390 Unspecified dementia without behavioral disturbance: Secondary | ICD-10-CM

## 2024-07-30 DIAGNOSIS — I2510 Atherosclerotic heart disease of native coronary artery without angina pectoris: Secondary | ICD-10-CM | POA: Diagnosis not present

## 2024-07-30 NOTE — Patient Instructions (Signed)
 Medication Instructions:  Your physician recommends that you continue on your current medications as directed. Please refer to the Current Medication list given to you today.  *If you need a refill on your cardiac medications before your next appointment, please call your pharmacy*  Lab Work: Your physician recommends that you return for lab work in:   Labs today: CMP, Lipids  If you have labs (blood work) drawn today and your tests are completely normal, you will receive your results only by: MyChart Message (if you have MyChart) OR A paper copy in the mail If you have any lab test that is abnormal or we need to change your treatment, we will call you to review the results.  Testing/Procedures: None  Follow-Up: At [facility], you and your health needs are our priority.  As part of our continuing mission to provide you with exceptional heart care, our providers are all part of one team.  This team includes your primary Cardiologist (physician) and Advanced Practice Providers or APPs (Physician Assistants and Nurse Practitioners) who all work together to provide you with the care you need, when you need it.  Your next appointment:   9 month(s)  Provider:   Redell Leiter, MD    We recommend signing up for the patient portal called MyChart.  Sign up information is provided on this After Visit Summary.  MyChart is used to connect with patients for Virtual Visits (Telemedicine).  Patients are able to view lab/test results, encounter notes, upcoming appointments, etc.  Non-urgent messages can be sent to your provider as well.   To learn more about what you can do with MyChart, go to [email or web address].   Other Instructions None

## 2024-07-31 ENCOUNTER — Ambulatory Visit: Payer: Self-pay | Admitting: Cardiology

## 2024-07-31 LAB — LIPID PANEL
Chol/HDL Ratio: 2.6 ratio (ref 0.0–4.4)
Cholesterol, Total: 111 mg/dL (ref 100–199)
HDL: 43 mg/dL (ref >39)
LDL Chol Calc (NIH): 52 mg/dL (ref 0–99)
Triglycerides: 80 mg/dL (ref 0–149)
VLDL Cholesterol Cal: 16 mg/dL (ref 5–40)

## 2024-07-31 LAB — COMPREHENSIVE METABOLIC PANEL WITH GFR
ALT: 19 IU/L (ref 0–32)
AST: 21 IU/L (ref 0–40)
Albumin: 4 g/dL (ref 3.7–4.7)
Alkaline Phosphatase: 73 IU/L (ref 48–129)
BUN/Creatinine Ratio: 20 (ref 12–28)
BUN: 23 mg/dL (ref 8–27)
Bilirubin Total: 0.4 mg/dL (ref 0.0–1.2)
CO2: 24 mmol/L (ref 20–29)
Calcium: 9.5 mg/dL (ref 8.7–10.3)
Chloride: 106 mmol/L (ref 96–106)
Creatinine, Ser: 1.14 mg/dL — ABNORMAL HIGH (ref 0.57–1.00)
Globulin, Total: 2.6 g/dL (ref 1.5–4.5)
Glucose: 98 mg/dL (ref 70–99)
Potassium: 4.5 mmol/L (ref 3.5–5.2)
Sodium: 141 mmol/L (ref 134–144)
Total Protein: 6.6 g/dL (ref 6.0–8.5)
eGFR: 46 mL/min/1.73 — ABNORMAL LOW (ref >59)

## 2024-08-04 DIAGNOSIS — F4323 Adjustment disorder with mixed anxiety and depressed mood: Secondary | ICD-10-CM | POA: Diagnosis not present

## 2024-08-14 ENCOUNTER — Ambulatory Visit: Admitting: Podiatry

## 2024-08-14 DIAGNOSIS — I739 Peripheral vascular disease, unspecified: Secondary | ICD-10-CM | POA: Diagnosis not present

## 2024-08-14 DIAGNOSIS — B351 Tinea unguium: Secondary | ICD-10-CM | POA: Diagnosis not present

## 2024-08-14 DIAGNOSIS — M79674 Pain in right toe(s): Secondary | ICD-10-CM

## 2024-08-14 DIAGNOSIS — L84 Corns and callosities: Secondary | ICD-10-CM | POA: Diagnosis not present

## 2024-08-14 DIAGNOSIS — M79675 Pain in left toe(s): Secondary | ICD-10-CM | POA: Diagnosis not present

## 2024-08-14 NOTE — Progress Notes (Signed)
 Subjective:  Patient ID: Christine Rich, female    DOB: [birth year],  MRN: [id number]  Christine Rich presents to clinic today for:  Chief Complaint  Patient presents with   [facility]    RFC with callous Not diabetic, Plavix    Patient notes nails are thick and elongated, causing pain in shoe gear when ambulating.  She has painful corns and calluses.  Patient has dementia, but her family member that drove her to the appointment did not come back for the exam.  Patient states that she was not happy because no one told her she had an appointment today.  PCP is Caleen Dirks, MD. last seen 04/16/2024  Past Medical History:  Diagnosis Date   Abnormal CXR 06/13/2019   Accelerated hypertension 03/15/2022   Acute bronchitis 03/15/2022   Angina at rest 03/15/2022   Anxiety 03/15/2022   Anxiety and depression 03/15/2022   Last Assessment & Plan:  Formatting of this note might be different from the original. - not on meds at home, clinically very anxious, noted received buspirone 5mg  x1 at midnight  - support provided   Boxers fracture 03/15/2022   CAD (coronary artery disease) of artery bypass graft 03/15/2022   Chest pain 06/06/2019   Last Assessment & Plan:  Formatting of this note might be different from the original. - intermittent chest pain over the past 1-2 weeks, currently pain free   - will check 2nd trop, CK today - EKG non-specific, start telemetry monitor  - cardiology consult pending  - Echo pending   Coronary artery disease involving native coronary artery of native heart 11/17/2015   Overview:  s/p CABGSx5 in 2003Lexiscan MPS in Aug 2011 without ischemia, EF 65% Cardiolite 10/29/12: CONCLUSIONS:  Study quality: fair, with obvious inferior attenuation artifact reducing the sensitivity of the test  (1) diaphragm attenuation artifact - prior non-transmural infarction cannot be excluded  (2) normal LV size, with ? inferior hypokinesis - measured LVEF of 48% may be an underestimate  (3) no areas of  (vasodilator) stress-induced hypoperfusion are identified.  Negative perfusion stress test for potenital ischemia.   Cardiac catheterization January 2018 drug-eluting stent to graft going to the posterior descending artery   Cough 06/13/2019   Cyst of skin and subcutaneous tissue 10/12/2020   Dyspnea on exertion 03/15/2022   Essential hypertension 11/17/2015   GERD (gastroesophageal reflux disease) 03/15/2022   Hx of CABG 03/15/2022   Hyperlipemia 11/17/2015   Hyperlipidemia 03/15/2022   Left against medical advice 03/15/2022   Malignant neoplasm of left female breast (HCC) 01/23/2018   Neck mass 10/11/2020   Nontoxic multinodular goiter 08/14/2016   NSTEMI (non-ST elevated myocardial infarction) (HCC) 10/18/2016   Orthopnea 03/15/2022   Pancreatic cyst 03/15/2022   Paresthesia 11/27/2019   Posterior neck pain 01/23/2018   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 - cough, fatigue, weakness for 1-2 weeks - nonspecific symptoms, will r/o COVID-19   Thyroid  mass 11/30/2020   Vitamin B 12 deficiency    Weakness 06/06/2019   Last Assessment & Plan:  Formatting of this note might be different from the original. - TSH WNL - B12 low, replacement as below  - r/o ACS, COVID-19, likely due to age and debility , PT to  see   No Known Allergies  Objective:  Christine Rich is a pleasant 89 y.o. female in NAD. AAO x 3.  Vascular Examination: Patient has palpable DP pulse, absent PT pulse bilateral.  Delayed capillary refill bilateral toes.  Sparse digital hair bilateral.  Proximal to distal cooling WNL bilateral.    Dermatological Examination: Interspaces are clear with no open lesions noted bilateral.  Skin is shiny and atrophic  bilateral.  Nails are 3-48mm thick, with yellowish/brown discoloration, subungual debris and distal onycholysis x10.  There is pain with compression of nails x10.  There are hyperkeratotic lesions present on the plantar aspect of the right second toe proximal to the PIPJ, the dorsal lateral aspect of the right fifth toe PIPJ and the medial aspect of the right bunion at the first metatarsal head.  Patient qualifies for at-risk foot care because of PVD.  Assessment/Plan: 1. Pain due to onychomycosis of toenails of both feet   2. Corns   3. PVD (peripheral vascular disease)     Mycotic nails x10 were sharply debrided with sterile nail nippers and power debriding burr to decrease bulk and length.  Hyperkeratotic lesions x 3 were shaved with #312 blade.  The locations are listed above in the dermatological examination.  They are all proximal to the toe DIPJ's.  Return in about 3 months (around 11/14/2024) for RFC.   Christine Rich, DPM, FACFAS [facility]     [address] [place], [place] [address]                Office [phone]  Fax [phone]

## 2024-08-19 DIAGNOSIS — F411 Generalized anxiety disorder: Secondary | ICD-10-CM | POA: Diagnosis not present

## 2024-08-19 DIAGNOSIS — F32A Depression, unspecified: Secondary | ICD-10-CM | POA: Diagnosis not present

## 2024-08-19 DIAGNOSIS — F0284 Dementia in other diseases classified elsewhere, unspecified severity, with anxiety: Secondary | ICD-10-CM | POA: Diagnosis not present

## 2024-08-19 DIAGNOSIS — G309 Alzheimer's disease, unspecified: Secondary | ICD-10-CM | POA: Diagnosis not present

## 2024-08-19 DIAGNOSIS — F0283 Dementia in other diseases classified elsewhere, unspecified severity, with mood disturbance: Secondary | ICD-10-CM | POA: Diagnosis not present

## 2024-08-19 DIAGNOSIS — F99 Mental disorder, not otherwise specified: Secondary | ICD-10-CM | POA: Diagnosis not present

## 2024-08-19 DIAGNOSIS — F5105 Insomnia due to other mental disorder: Secondary | ICD-10-CM | POA: Diagnosis not present

## 2024-09-04 DIAGNOSIS — F4323 Adjustment disorder with mixed anxiety and depressed mood: Secondary | ICD-10-CM | POA: Diagnosis not present

## 2024-09-05 DIAGNOSIS — B351 Tinea unguium: Secondary | ICD-10-CM | POA: Diagnosis not present

## 2024-09-05 DIAGNOSIS — I7091 Generalized atherosclerosis: Secondary | ICD-10-CM | POA: Diagnosis not present

## 2024-09-10 DIAGNOSIS — Z87440 Personal history of urinary (tract) infections: Secondary | ICD-10-CM | POA: Diagnosis not present

## 2024-09-10 DIAGNOSIS — R5381 Other malaise: Secondary | ICD-10-CM | POA: Diagnosis not present

## 2024-09-10 DIAGNOSIS — N390 Urinary tract infection, site not specified: Secondary | ICD-10-CM | POA: Diagnosis not present

## 2024-09-10 DIAGNOSIS — R4182 Altered mental status, unspecified: Secondary | ICD-10-CM | POA: Diagnosis not present

## 2024-09-10 DIAGNOSIS — F0390 Unspecified dementia without behavioral disturbance: Secondary | ICD-10-CM | POA: Diagnosis not present

## 2024-09-10 DIAGNOSIS — R8290 Unspecified abnormal findings in urine: Secondary | ICD-10-CM | POA: Diagnosis not present

## 2024-09-11 DIAGNOSIS — N390 Urinary tract infection, site not specified: Secondary | ICD-10-CM | POA: Diagnosis not present

## 2024-09-11 DIAGNOSIS — Z743 Need for continuous supervision: Secondary | ICD-10-CM | POA: Diagnosis not present

## 2024-09-15 DIAGNOSIS — F03918 Unspecified dementia, unspecified severity, with other behavioral disturbance: Secondary | ICD-10-CM | POA: Diagnosis not present

## 2024-09-15 DIAGNOSIS — N390 Urinary tract infection, site not specified: Secondary | ICD-10-CM | POA: Diagnosis not present

## 2024-09-15 DIAGNOSIS — R4182 Altered mental status, unspecified: Secondary | ICD-10-CM | POA: Diagnosis not present

## 2024-09-16 DIAGNOSIS — F02C4 Dementia in other diseases classified elsewhere, severe, with anxiety: Secondary | ICD-10-CM | POA: Diagnosis not present

## 2024-09-16 DIAGNOSIS — F02C11 Dementia in other diseases classified elsewhere, severe, with agitation: Secondary | ICD-10-CM | POA: Diagnosis not present

## 2024-09-16 DIAGNOSIS — F330 Major depressive disorder, recurrent, mild: Secondary | ICD-10-CM | POA: Diagnosis not present

## 2024-09-16 DIAGNOSIS — F05 Delirium due to known physiological condition: Secondary | ICD-10-CM | POA: Diagnosis not present

## 2024-09-16 DIAGNOSIS — F5105 Insomnia due to other mental disorder: Secondary | ICD-10-CM | POA: Diagnosis not present

## 2024-09-16 DIAGNOSIS — F02C3 Dementia in other diseases classified elsewhere, severe, with mood disturbance: Secondary | ICD-10-CM | POA: Diagnosis not present

## 2024-09-16 DIAGNOSIS — G309 Alzheimer's disease, unspecified: Secondary | ICD-10-CM | POA: Diagnosis not present

## 2024-09-16 DIAGNOSIS — F411 Generalized anxiety disorder: Secondary | ICD-10-CM | POA: Diagnosis not present

## 2024-09-26 DIAGNOSIS — E559 Vitamin D deficiency, unspecified: Secondary | ICD-10-CM | POA: Diagnosis not present

## 2024-09-26 DIAGNOSIS — R262 Difficulty in walking, not elsewhere classified: Secondary | ICD-10-CM | POA: Diagnosis not present

## 2024-09-26 DIAGNOSIS — E785 Hyperlipidemia, unspecified: Secondary | ICD-10-CM | POA: Diagnosis not present

## 2024-10-02 DIAGNOSIS — F03918 Unspecified dementia, unspecified severity, with other behavioral disturbance: Secondary | ICD-10-CM | POA: Diagnosis not present

## 2024-10-02 DIAGNOSIS — B9620 Unspecified Escherichia coli [E. coli] as the cause of diseases classified elsewhere: Secondary | ICD-10-CM | POA: Diagnosis not present

## 2024-10-06 DIAGNOSIS — R0989 Other specified symptoms and signs involving the circulatory and respiratory systems: Secondary | ICD-10-CM | POA: Diagnosis not present

## 2024-10-06 DIAGNOSIS — R059 Cough, unspecified: Secondary | ICD-10-CM | POA: Diagnosis not present

## 2024-10-06 DIAGNOSIS — J069 Acute upper respiratory infection, unspecified: Secondary | ICD-10-CM | POA: Diagnosis not present
